# Patient Record
Sex: Female | Born: 1970
Health system: Southern US, Community
[De-identification: ages and names within clinical notes are randomized; demographics above are authoritative.]

## PROBLEM LIST (undated history)

## (undated) DIAGNOSIS — D649 Anemia, unspecified: Secondary | ICD-10-CM

## (undated) DIAGNOSIS — K5900 Constipation, unspecified: Secondary | ICD-10-CM

## (undated) HISTORY — PX: TUBAL LIGATION: SHX77

## (undated) HISTORY — DX: Anemia, unspecified: D64.9

## (undated) HISTORY — DX: Constipation, unspecified: K59.00

---

## 2004-12-31 ENCOUNTER — Observation Stay (HOSPITAL_COMMUNITY): Admission: AD | Admit: 2004-12-31 | Discharge: 2005-01-01 | Payer: Self-pay | Admitting: Obstetrics & Gynecology

## 2004-12-31 ENCOUNTER — Ambulatory Visit: Payer: Self-pay | Admitting: Obstetrics & Gynecology

## 2005-01-07 ENCOUNTER — Ambulatory Visit: Payer: Self-pay | Admitting: *Deleted

## 2005-01-27 ENCOUNTER — Ambulatory Visit (HOSPITAL_COMMUNITY): Admission: RE | Admit: 2005-01-27 | Discharge: 2005-01-27 | Payer: Self-pay | Admitting: Obstetrics & Gynecology

## 2005-04-18 ENCOUNTER — Ambulatory Visit: Payer: Self-pay | Admitting: Family Medicine

## 2005-04-18 ENCOUNTER — Inpatient Hospital Stay (HOSPITAL_COMMUNITY): Admission: AD | Admit: 2005-04-18 | Discharge: 2005-04-20 | Payer: Self-pay | Admitting: Family Medicine

## 2005-04-18 ENCOUNTER — Ambulatory Visit: Payer: Self-pay | Admitting: Obstetrics & Gynecology

## 2005-05-02 ENCOUNTER — Ambulatory Visit: Payer: Self-pay | Admitting: Family Medicine

## 2006-08-09 IMAGING — US US OB COMP +14 WK
1 series · 13 of 28 positions shown · non-contrast
Comparison: none

CLINICAL DATA: 24 weeks pregnant, no prenatal care.

[Series 1: us ob comp +14 wk · 0.33mm/px · 13 of 45 slices shown]
[im 2/45]
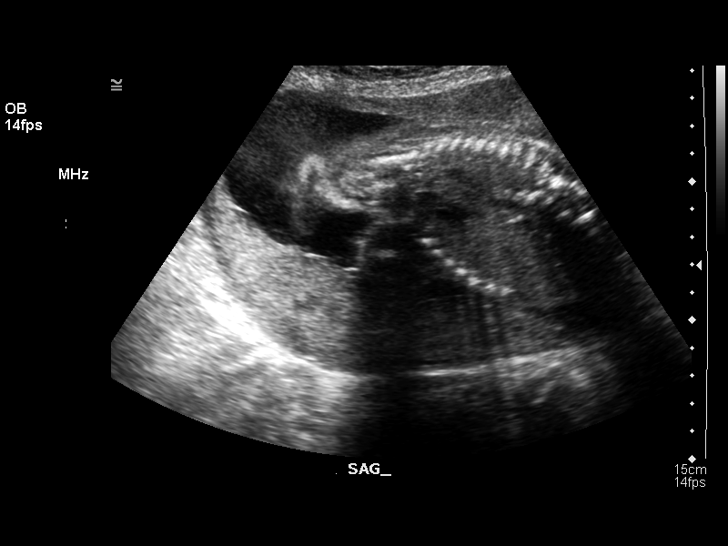
[im 5/45]
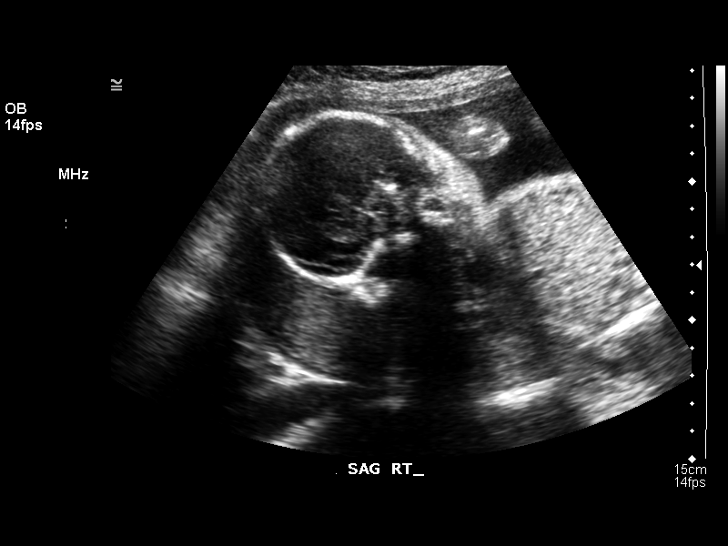
[im 9/45]
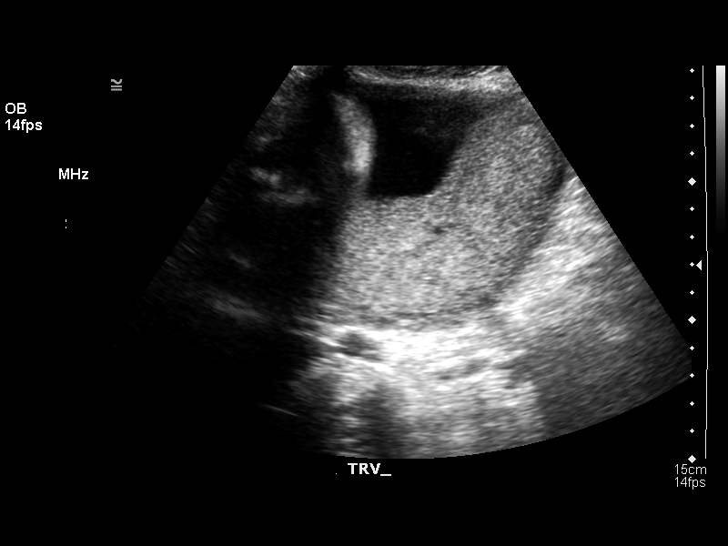
[im 12/45]
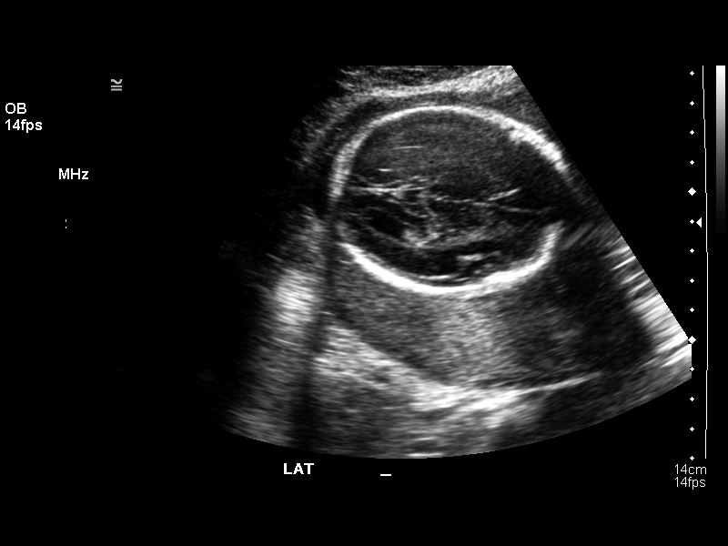
[im 15/45]
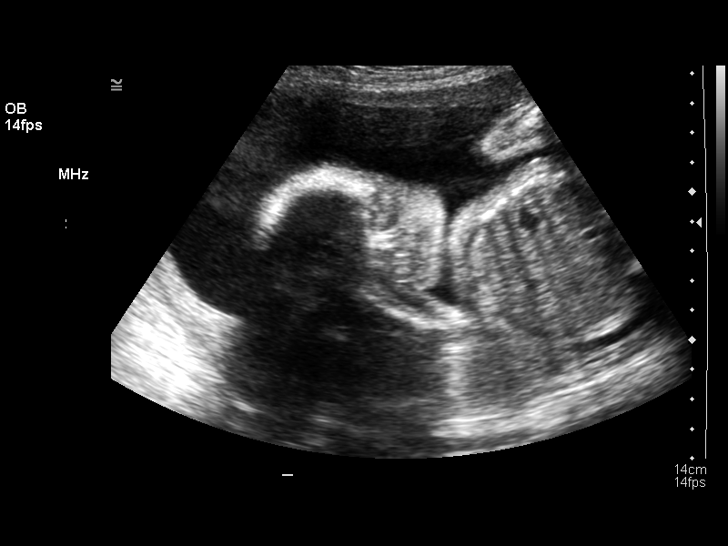
[im 18/45]
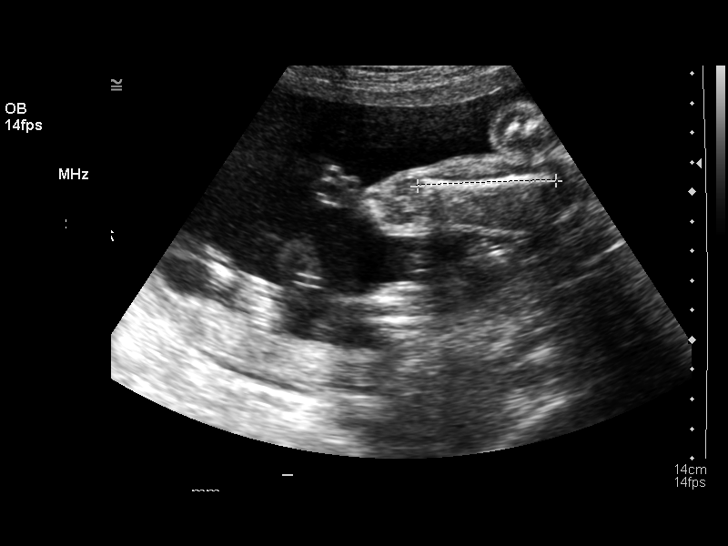
[im 23/45]
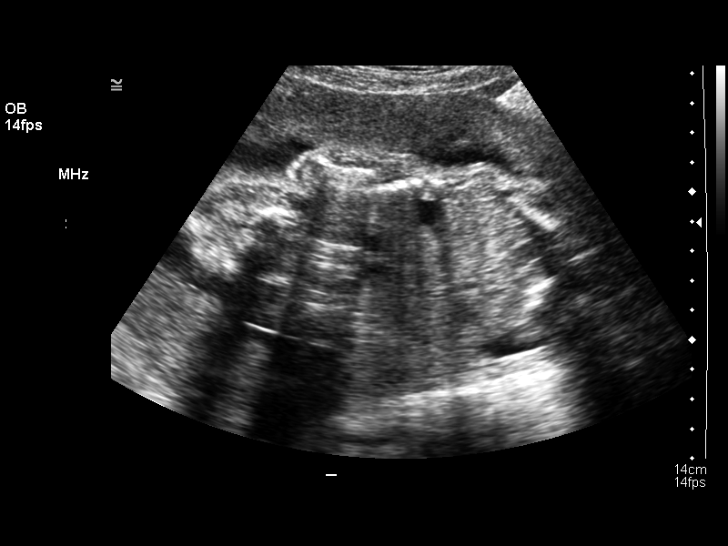
[im 27/45]
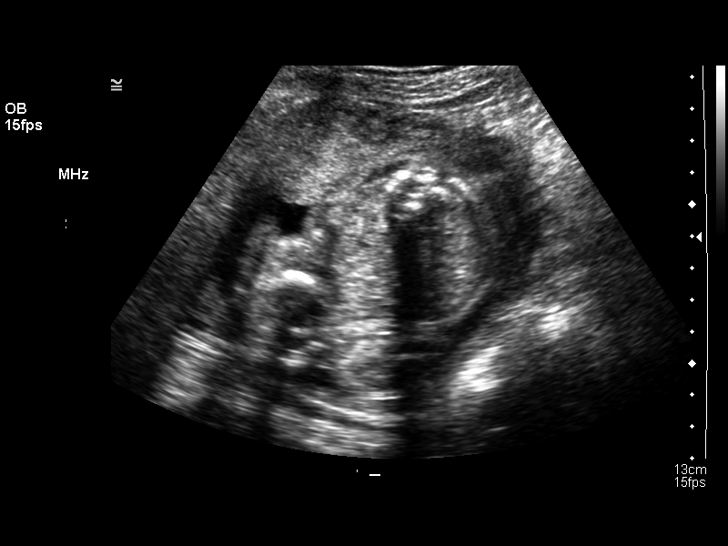
[im 30/45]
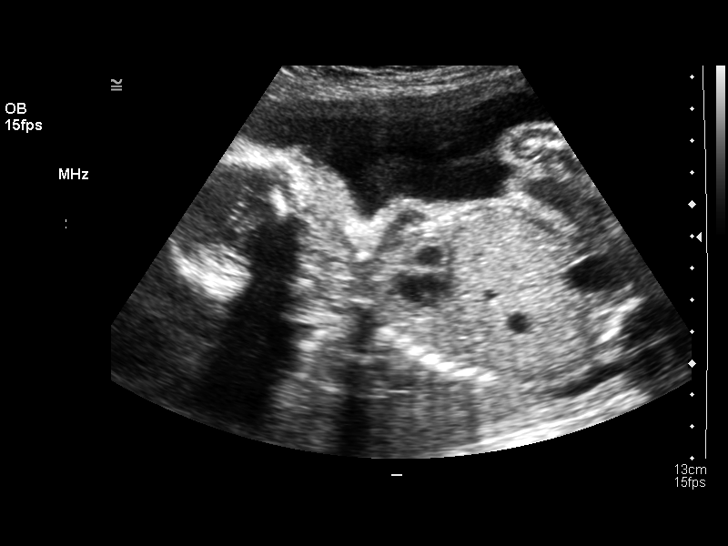
[im 33/45]
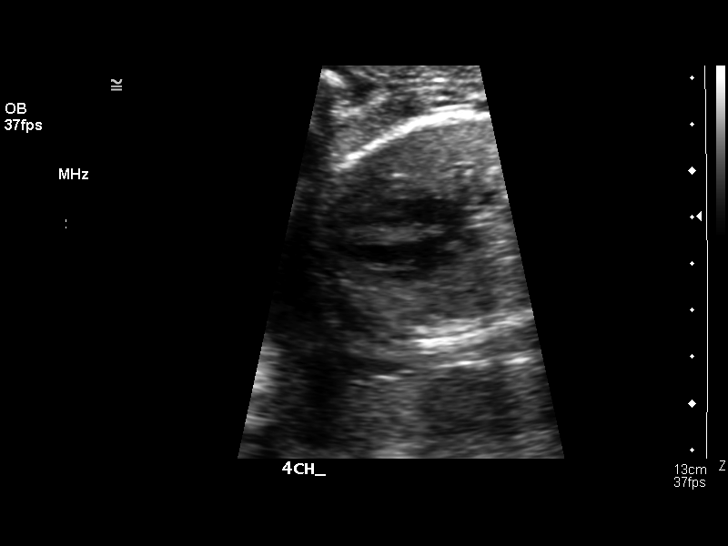
[im 36/45]
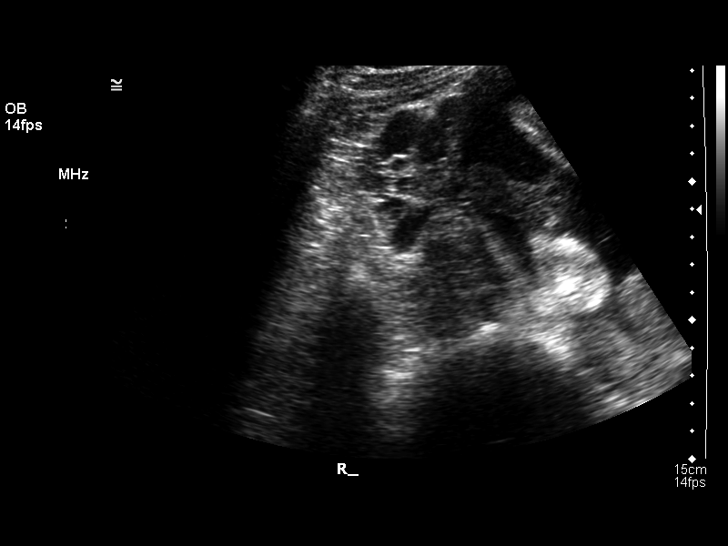
[im 40/45]
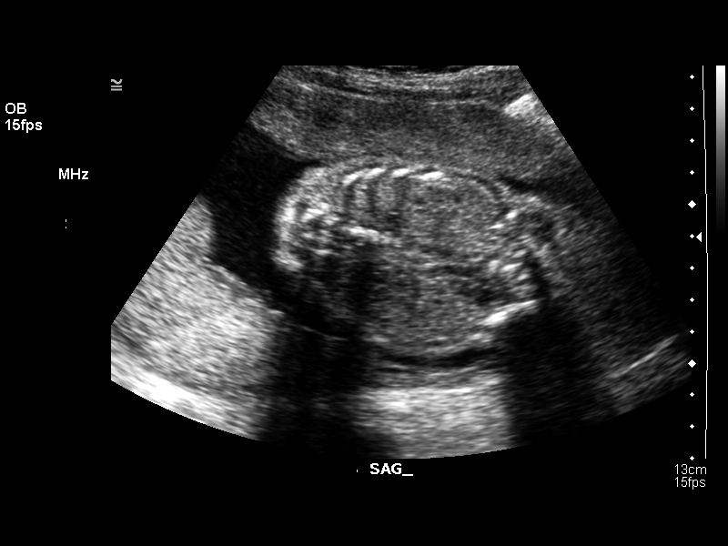
[im 43/45]
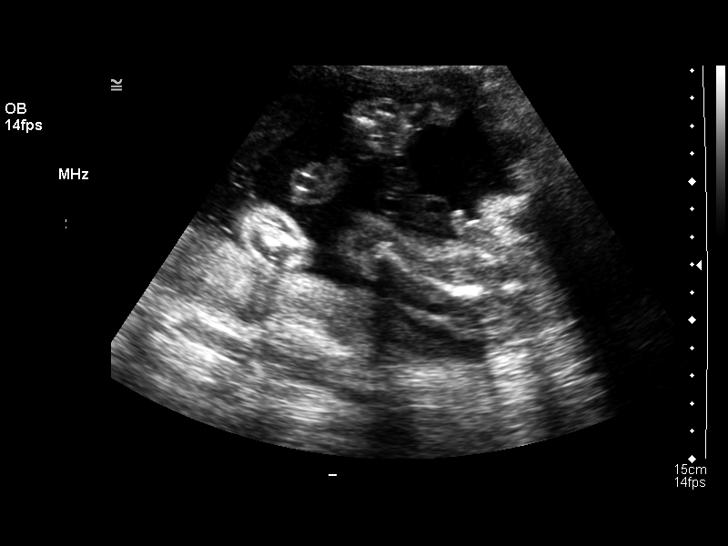

[13 of 28 positions shown; findings below may reference images not displayed]

OBSTETRICAL ULTRASOUND:
 Number of Fetuses:  1
 Heart Rate:  153
 Movement:  Yes
 Breathing:  No  
 Presentation:  Breech
 Placental Location:  Posterior
 Grade:  I
 Previa:  No
 Amniotic Fluid (Subjective):  Normal
 Amniotic Fluid (Objective):   5.7 cm Vertical pocket 

 FETAL BIOMETRY
 BPD:   6.1 cm   25 w 0 d
 HC:   22.8 cm   24 w 6 d
 AC:   20.4 cm   25 w 0 d
 FL:    4.3 cm  24 w 2 d

 MEAN GA:  24 w 6 d

 FETAL ANATOMY
 Lateral Ventricles:    Visualized 
 Thalami/CSP:      Visualized 
 Posterior Fossa:  Visualized 
 Nuchal Region:    N/A
 Spine:      Not visualized 
 4 Chamber Heart on Left:      Visualized   
 Stomach on Left:      Visualized 
 3 Vessel Cord:    Not visualized 
 Cord Insertion site:    Not visualized 
 Kidneys:  Visualized 
 Bladder:  Visualized 
 Extremities:      Not visualized 

 ADDITIONAL ANATOMY VISUALIZED:  Diaphragm and male genitalia.  

 MATERNAL UTERINE AND ADNEXAL FINDINGS
 Cervix: 4.1 cm Transabdominally.  Ovaries not visualized.
IMPRESSION: 1.  Single live intrauterine gestation with measurements corresponding to 24 weeks 6 days estimated gestational age.  
 2.  Incomplete morphologic survey due to fetal position and advanced gestational age.  
 3.  Breech presentation with normal amniotic fluid volume.
 4.  No placenta previa.

## 2006-09-05 IMAGING — US US OB FOLLOW-UP
1 series · 13 of 28 positions shown · non-contrast
Comparison: none

CLINICAL DATA: Reassess fetal anatomy.

[Series 1: us ob follow-up · 0.39mm/px · 13 of 64 slices shown]
[im 3/64]
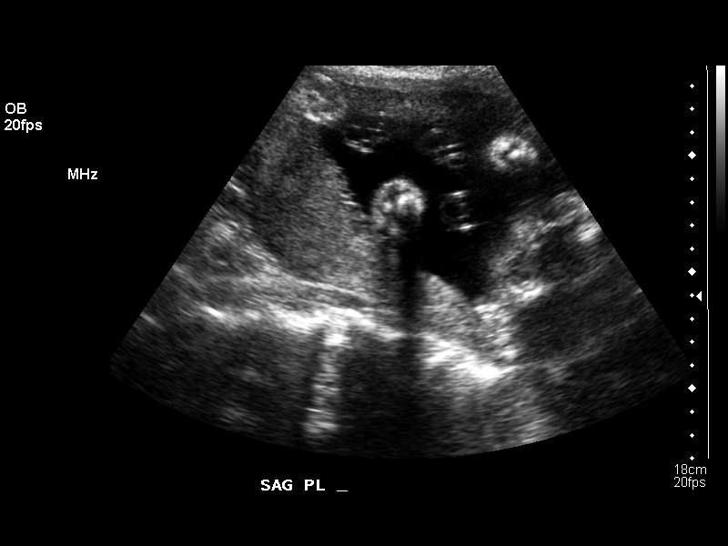
[im 8/64]
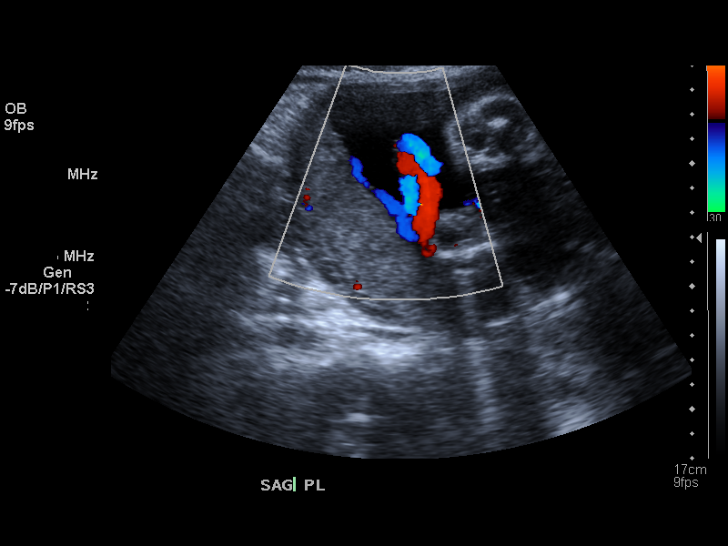
[im 12/64]
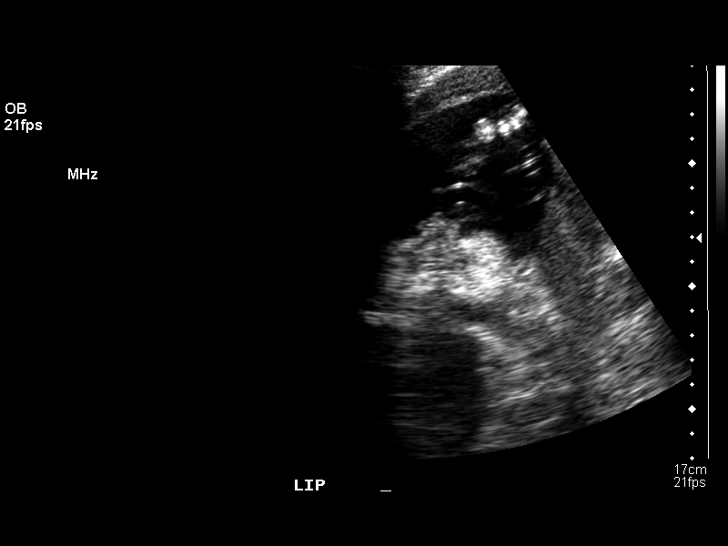
[im 17/64]
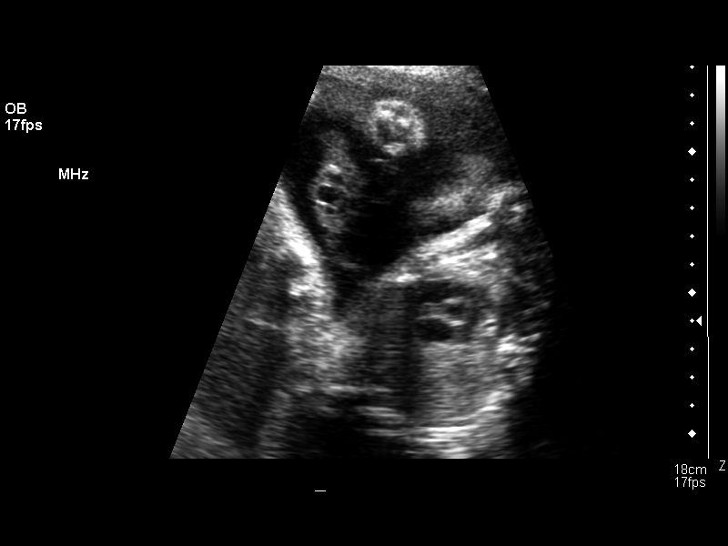
[im 22/64]
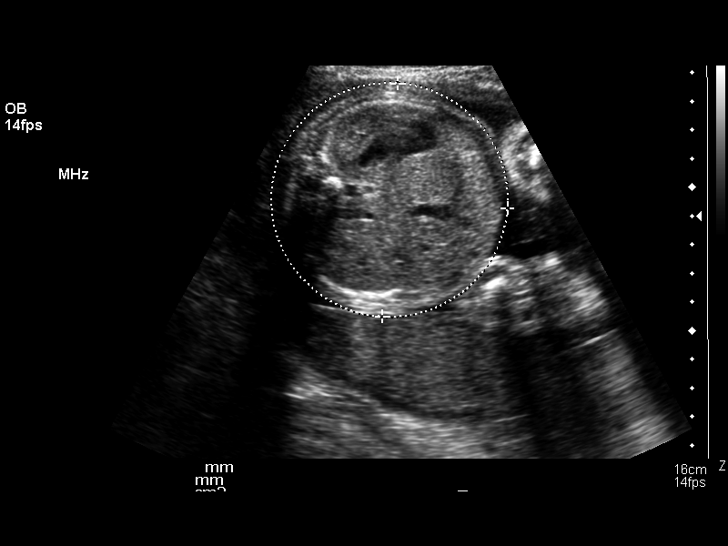
[im 26/64]
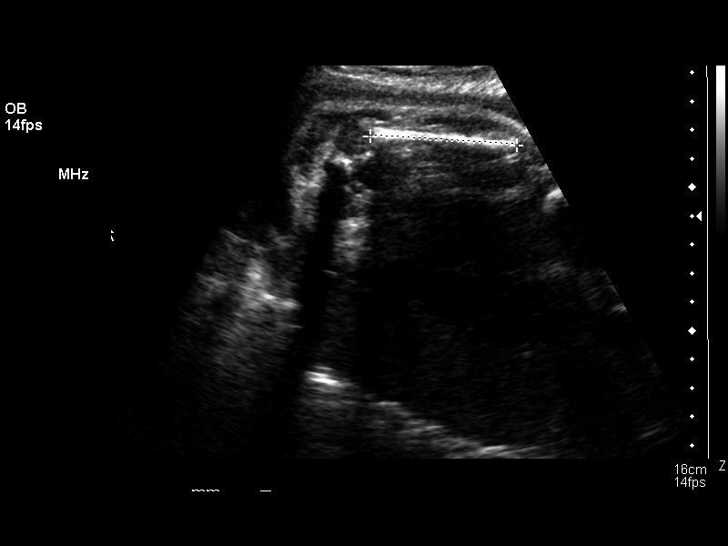
[im 33/64]
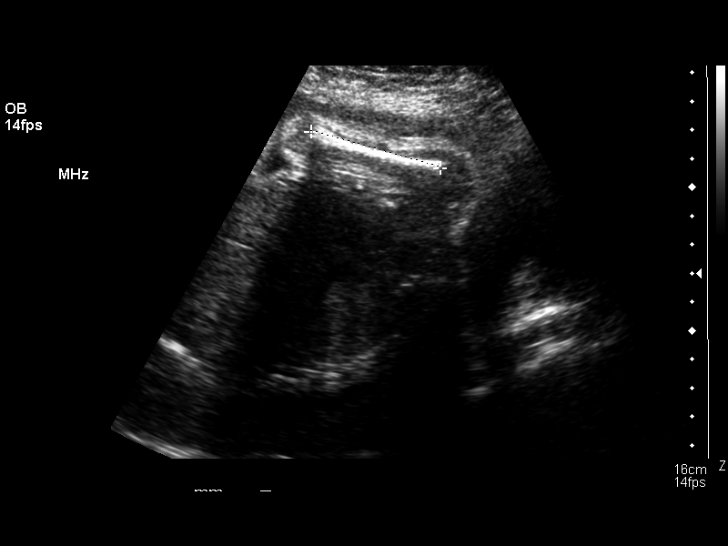
[im 38/64]
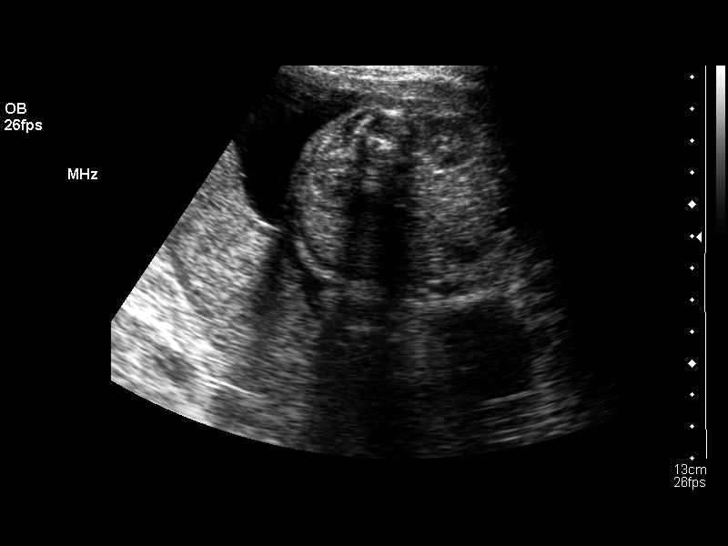
[im 43/64]
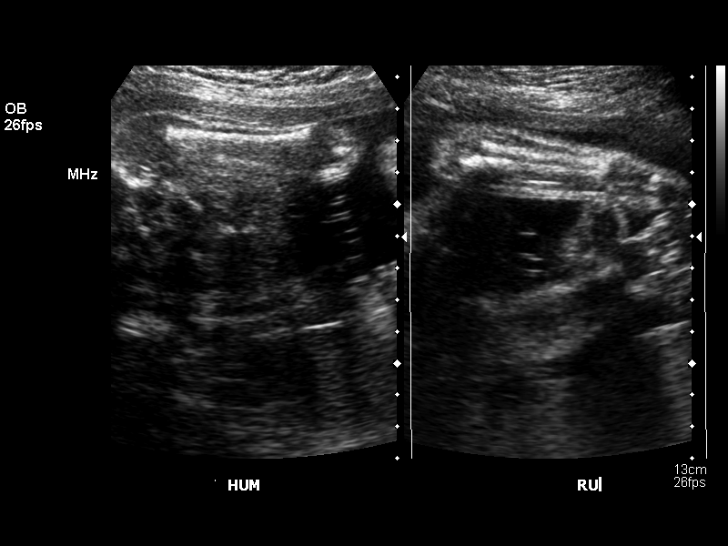
[im 47/64]
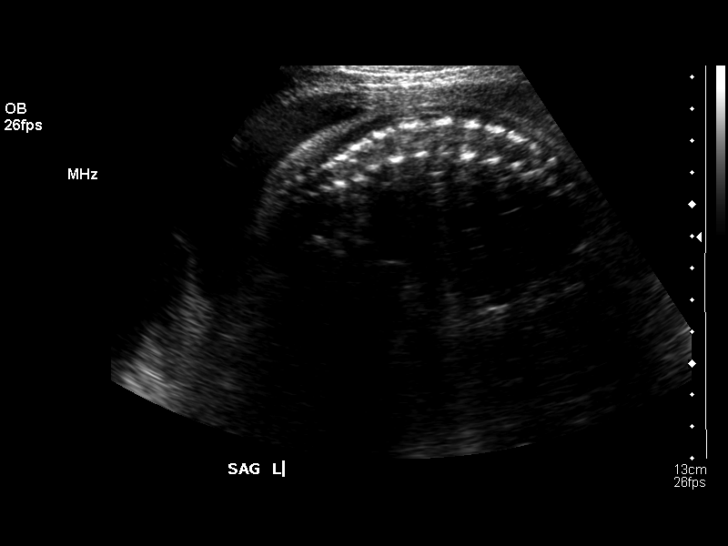
[im 52/64]
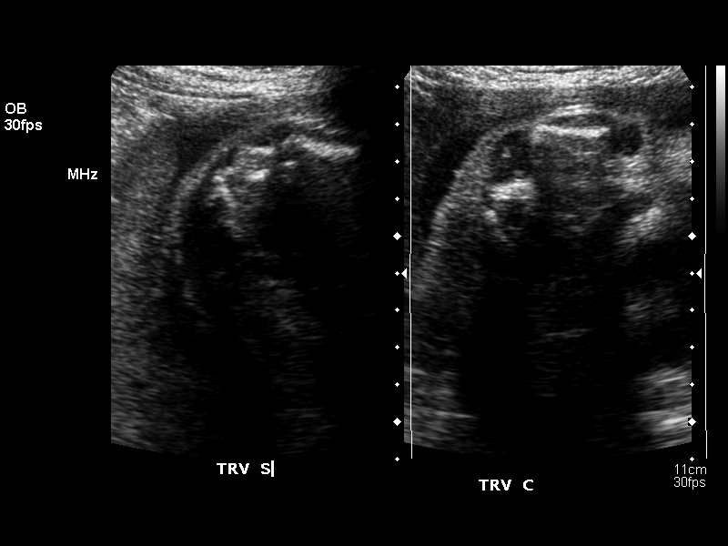
[im 57/64]
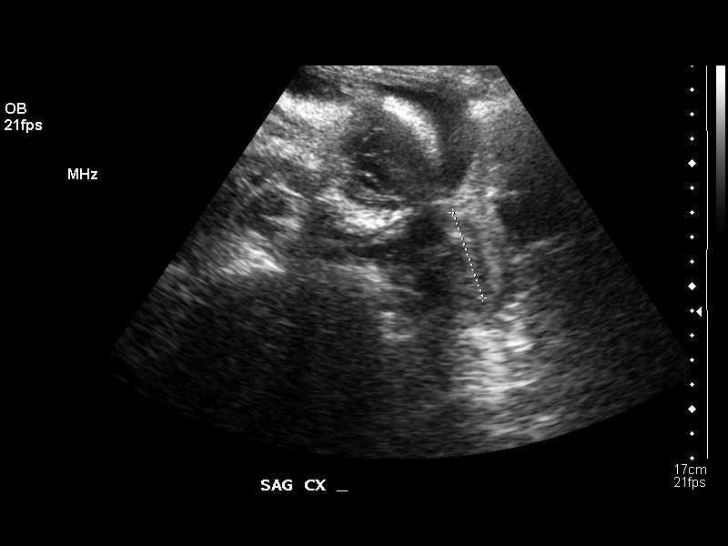
[im 61/64]
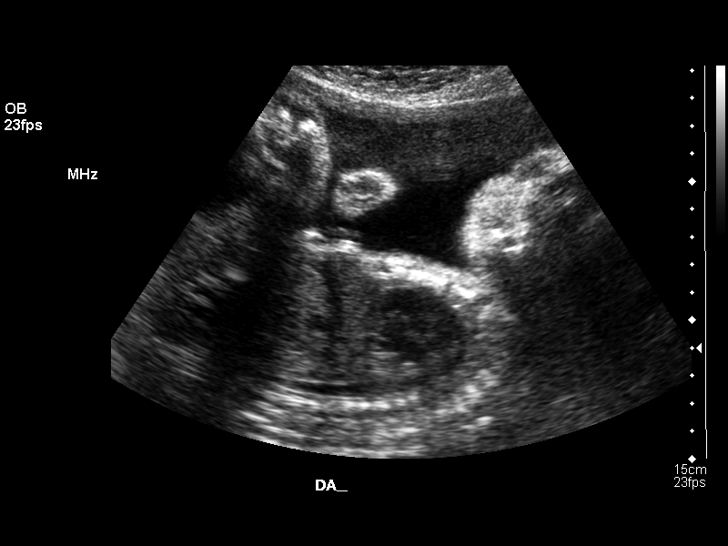

[13 of 28 positions shown; findings below may reference images not displayed]

OBSTETRICAL ULTRASOUND RE-EVALUATION:
Number of Fetuses:  1
Heart Rate:  139 bpm
Movement:  Yes 
Breathing:  Yes 
Presentation:  Cephalic 
Placental Location:  Posterior 
Grade:  1
Previa:  No 
Amniotic Fluid (subjective):  Normal 
Amniotic Fluid (objective):  AFI 13.8 cm (4th-14th %ile = 9.2-23.1 cm for 29 weeks) 

FETAL BIOMETRY
BPD:  7.3 cm  29 w  1 d
HC:  27.1 cm  29 w  4 d
AC:  25.1 cm  29 w  2 d
FL:  5.1 cm  27 w  3 d

BPD/OFD:   .80 (0.70-0.86)
FL/BPD  .70 (0.71-0.87)
FL/AC  .20 (0.20-1.24)
HC/AC  1.08 (1.05-1.22) 

MEAN GA:  28 w  4 d 
Assigned GA:  28 w  5 d

EFW:  2805 grams (H) 50-75th %ile (4404-2613 g) for 29 weeks 

FETAL ANATOMY
Lateral Ventricles:  Visualized 
Thalami/CSP:  Visualized 
Posterior Fossa:  Visualized   
Nuchal Region:  N/A
Spine:  Visualized 
4 Chamber Heart on Left:  Visualized 
Stomach on Left:  Visualized 
3 Vessel Cord:  Visualized 
Cord Insertion Site:  Not Visualized 
Kidneys:  Visualized 
Bladder:  Visualized 
Extremities:  Visualized   

ADDITIONAL ANATOMY VISUALIZED:  LVOT, RVOT, upper lip, orbits, profile, diaphragm, heel, 5th digit, ductal arch, male genitalia and nasal bone

MATERNAL UTERINE AND ADNEXAL FINDINGS
Cervix:  3.7 cm transabdominally
IMPRESSION: 1.   Single intrauterine pregnancy demonstrating an estimated gestational age by ultrasound of 28 weeks and 4 days.   Correlation with assigned gestational age by initial ultrasound of 28 weeks and 5 days suggests appropriate growth.   Currently the estimated fetal weight is between the 50 and 75th percentile for a 29 week gestation.

2.  Subjectively and quantitatively normal amniotic fluid volume and normal cervical length.   

3.  An improved anatomic assessment was possible with visualization today of the spine, three vessel cord, cardiac outflow tract, facial anatomy, diaphragm, heel, 5th digit and ductal arch.   Aortic arch and cord insertion site remain incompletely assessed due to positioning combined with advanced gestational age.   

4.  Subjectively and quantitatively normal amniotic fluid volume and normal cervical length.

## 2016-09-22 ENCOUNTER — Ambulatory Visit: Payer: Medicaid Other | Attending: Family Medicine | Admitting: Family Medicine

## 2016-09-22 ENCOUNTER — Encounter: Payer: Self-pay | Admitting: Family Medicine

## 2016-09-22 VITALS — BP 127/82 | HR 88 | Temp 98.1°F | Resp 18 | Ht 61.0 in | Wt 123.8 lb

## 2016-09-22 DIAGNOSIS — Z0000 Encounter for general adult medical examination without abnormal findings: Secondary | ICD-10-CM | POA: Diagnosis not present

## 2016-09-22 DIAGNOSIS — Z23 Encounter for immunization: Secondary | ICD-10-CM | POA: Diagnosis not present

## 2016-09-22 NOTE — Progress Notes (Signed)
Patient is here for physical  Patient denies pain for today  Patient is not taking any current medication  Patient has not eaten for today

## 2016-09-22 NOTE — Progress Notes (Signed)
Subjective:   Patient ID: Misty Singh, female    DOB: 23-Jun-1971, 46 y.o.   MRN: [id number]  Chief Complaint  Patient presents with  . Establish Care   HPI Misty Singh 46 y.o. female presents with   Annual physical examination:    She is accompanied by her daughter and a language interpreter. She denies any family history of cancer, diabetes, or hypertension. She denies any consitutional symptoms, CP, SOB, or swelling of the BLE. Denies any SI/HI. Denies symptoms of all other pertinent systems.   History reviewed. No pertinent past medical history.  History reviewed. No pertinent surgical history.  History reviewed. No pertinent family history.  Social History   Social History  . Marital status: Single    Spouse name: N/A  . Number of children: N/A  . Years of education: N/A   Occupational History  . Not on file.   Social History Main Topics  . Smoking status: Never Smoker  . Smokeless tobacco: Never Used  . Alcohol use No  . Drug use: No  . Sexual activity: Not on file   Other Topics Concern  . Not on file   Social History Narrative  . No narrative on file    No outpatient prescriptions prior to visit.   No facility-administered medications prior to visit.     No Known Allergies  Review of Systems  Constitutional: Negative.   HENT: Negative.   Eyes: Negative.   Respiratory: Negative.   Cardiovascular: Negative.   Gastrointestinal: Negative.   Genitourinary: Negative.   Musculoskeletal: Negative.   Skin: Negative.   Neurological: Negative.   Endo/Heme/Allergies: Negative.   Psychiatric/Behavioral: Negative.      Objective:    Physical Exam  Constitutional: She is oriented to person, place, and time. She appears well-developed and well-nourished.  HENT:  Head: Normocephalic and atraumatic.  Right Ear: External ear normal.  Left Ear: External ear normal.  Nose: Nose normal.  Mouth/Throat: Oropharynx is clear and moist.  Eyes: Conjunctivae and  EOM are normal. Pupils are equal, round, and reactive to light.  Neck: Normal range of motion. Neck supple.  Cardiovascular: Normal rate, regular rhythm, normal heart sounds and intact distal pulses.   Pulmonary/Chest: Effort normal and breath sounds normal.  Abdominal: Soft. She exhibits no mass. Bowel sounds are increased.  Musculoskeletal: Normal range of motion.  Lymphadenopathy:    She has no cervical adenopathy.  Neurological: She is alert and oriented to person, place, and time. She has normal reflexes.  Skin: Skin is warm and dry.  Psychiatric: She has a normal mood and affect. Her behavior is normal. Thought content normal.  Nursing note and vitals reviewed.   BP 127/82 (BP Location: Left Arm, Patient Position: Sitting, Cuff Size: Normal)   Pulse 88   Temp 98.1 F (36.7 C) (Oral)   Resp 18   Ht 5' 1"  (1.549 m)   Wt 123 lb 12.8 oz (56.2 kg)   SpO2 98%   BMI 23.39 kg/m  Wt Readings from Last 3 Encounters:  09/22/16 123 lb 12.8 oz (56.2 kg)    Lab Results  Component Value Date   TSH 0.706 09/22/2016   Lab Results  Component Value Date   WBC 8.2 09/22/2016   HCT 38.9 09/22/2016   MCV 74 (L) 09/22/2016   PLT 367 09/22/2016   Lab Results  Component Value Date   NA 139 09/22/2016   K 4.4 09/22/2016   CO2 23 09/22/2016   GLUCOSE 99 09/22/2016  BUN 9 09/22/2016   CREATININE 0.54 (L) 09/22/2016   BILITOT 0.5 09/22/2016   ALKPHOS 62 09/22/2016   AST 19 09/22/2016   ALT 17 09/22/2016   PROT 7.9 09/22/2016   ALBUMIN 4.7 09/22/2016   CALCIUM 9.4 09/22/2016   Lab Results  Component Value Date   CHOL 135 09/22/2016   Lab Results  Component Value Date   HDL 48 09/22/2016   Lab Results  Component Value Date   LDLCALC 64 09/22/2016   Lab Results  Component Value Date   TRIG 117 09/22/2016   Lab Results  Component Value Date   CHOLHDL 2.8 09/22/2016   Lab Results  Component Value Date   HGBA1C 5.5 09/22/2016       Assessment & Plan:   Problem  List Items Addressed This Visit    None    Visit Diagnoses    Annual physical exam    -  Primary   Relevant Orders   CMP14+EGFR   Lipid Panel   TSH   Hemoglobin A1c   CBC with Differential   Vitamin D, 25-hydroxy   Healthcare maintenance       -Declined referral for MM screening at this time.    Relevant Orders   Tdap vaccine greater than or equal to 7yo IM      Follow up: Return in about 2 weeks (around 10/06/2016) for routine pap .   Fredia Beets, FNP

## 2016-09-23 LAB — CMP14+EGFR
ALBUMIN: 4.7 g/dL (ref 3.5–5.5)
ALK PHOS: 62 IU/L (ref 39–117)
ALT: 17 IU/L (ref 0–32)
AST: 19 IU/L (ref 0–40)
Albumin/Globulin Ratio: 1.5 (ref 1.2–2.2)
BILIRUBIN TOTAL: 0.5 mg/dL (ref 0.0–1.2)
BUN / CREAT RATIO: 17 (ref 9–23)
BUN: 9 mg/dL (ref 6–24)
CHLORIDE: 98 mmol/L (ref 96–106)
CO2: 23 mmol/L (ref 18–29)
Calcium: 9.4 mg/dL (ref 8.7–10.2)
Creatinine, Ser: 0.54 mg/dL — ABNORMAL LOW (ref 0.57–1.00)
GFR calc Af Amer: 130 mL/min/{1.73_m2} (ref >59)
GFR calc non Af Amer: 113 mL/min/{1.73_m2} (ref >59)
GLOBULIN, TOTAL: 3.2 g/dL (ref 1.5–4.5)
GLUCOSE: 99 mg/dL (ref 65–99)
Potassium: 4.4 mmol/L (ref 3.5–5.2)
SODIUM: 139 mmol/L (ref 134–144)
Total Protein: 7.9 g/dL (ref 6.0–8.5)

## 2016-09-23 LAB — CBC WITH DIFFERENTIAL/PLATELET
BASOS ABS: 0.1 10*3/uL (ref 0.0–0.2)
Basos: 2 %
EOS (ABSOLUTE): 1.7 10*3/uL — AB (ref 0.0–0.4)
Eos: 20 %
Hematocrit: 38.9 % (ref 34.0–46.6)
Hemoglobin: 11.7 g/dL (ref 11.1–15.9)
IMMATURE GRANS (ABS): 0 10*3/uL (ref 0.0–0.1)
IMMATURE GRANULOCYTES: 0 %
LYMPHS: 33 %
Lymphocytes Absolute: 2.7 10*3/uL (ref 0.7–3.1)
MCH: 22.1 pg — ABNORMAL LOW (ref 26.6–33.0)
MCHC: 30.1 g/dL — ABNORMAL LOW (ref 31.5–35.7)
MCV: 74 fL — AB (ref 79–97)
Monocytes Absolute: 0.6 10*3/uL (ref 0.1–0.9)
Monocytes: 7 %
NEUTROS PCT: 38 %
Neutrophils Absolute: 3.2 10*3/uL (ref 1.4–7.0)
PLATELETS: 367 10*3/uL (ref 150–379)
RBC: 5.29 x10E6/uL — ABNORMAL HIGH (ref 3.77–5.28)
RDW: 14.7 % (ref 12.3–15.4)
WBC: 8.2 10*3/uL (ref 3.4–10.8)

## 2016-09-23 LAB — HEMOGLOBIN A1C
ESTIMATED AVERAGE GLUCOSE: 111 mg/dL
HEMOGLOBIN A1C: 5.5 % (ref 4.8–5.6)

## 2016-09-23 LAB — LIPID PANEL
CHOLESTEROL TOTAL: 135 mg/dL (ref 100–199)
Chol/HDL Ratio: 2.8 ratio units (ref 0.0–4.4)
HDL: 48 mg/dL (ref >39)
LDL Calculated: 64 mg/dL (ref 0–99)
Triglycerides: 117 mg/dL (ref 0–149)
VLDL Cholesterol Cal: 23 mg/dL (ref 5–40)

## 2016-09-23 LAB — TSH: TSH: 0.706 u[IU]/mL (ref 0.450–4.500)

## 2016-09-23 LAB — VITAMIN D 25 HYDROXY (VIT D DEFICIENCY, FRACTURES): Vit D, 25-Hydroxy: 17.9 ng/mL — ABNORMAL LOW (ref 30.0–100.0)

## 2016-09-30 ENCOUNTER — Telehealth: Payer: Self-pay

## 2016-09-30 ENCOUNTER — Other Ambulatory Visit: Payer: Self-pay | Admitting: Family Medicine

## 2016-09-30 DIAGNOSIS — E559 Vitamin D deficiency, unspecified: Secondary | ICD-10-CM

## 2016-09-30 DIAGNOSIS — D509 Iron deficiency anemia, unspecified: Secondary | ICD-10-CM

## 2016-09-30 MED ORDER — VITAMIN D (ERGOCALCIFEROL) 1.25 MG (50000 UNIT) PO CAPS: 50000.0000 [IU] | ORAL_CAPSULE | ORAL | 0 refills | Status: AC

## 2016-09-30 MED ORDER — FERROUS SULFATE 325 (65 FE) MG PO TABS: 325.0000 mg | ORAL_TABLET | Freq: Two times a day (BID) | ORAL | 2 refills | Status: DC

## 2016-09-30 NOTE — Telephone Encounter (Signed)
-----   Message from Lizbeth Bark, FNP sent at 09/30/2016 10:00 AM EDT ----- Kidney function normal. Liver function normal Cholesterol levels normal. Thyroid function normal HgbA1c which screens for diabetes is normal. You do not have diabetes. Vitamin D level was low. Vitamin D helps to keep bones strong. You were prescribed ergocalciferol (capsules) to increase your vitamin-d level. Once finished start taking OTC vitamin d supplement with 800 international units (IU) of vitamin-d per day. Recommend recheck in 3 months. Iron levels are low you have been prescribed an iron supplement to help increase these levels. Recommend recheck  in 3 months.  Increase your dietary iron intake. Good sources of iron include dark green leafy vegetables, meats, beans, and iron fortified cereals.

## 2016-09-30 NOTE — Telephone Encounter (Signed)
CMA call to inform patient about lab results  Patient Verify DOB  Patient was aware and understood   

## 2016-10-06 ENCOUNTER — Other Ambulatory Visit (HOSPITAL_COMMUNITY)
Admission: RE | Admit: 2016-10-06 | Discharge: 2016-10-06 | Disposition: A | Discharge status: Home / Self Care | Payer: Medicaid Other | Source: Ambulatory Visit | Attending: Family Medicine | Admitting: Family Medicine

## 2016-10-06 ENCOUNTER — Ambulatory Visit: Payer: Medicaid Other | Attending: Family Medicine | Admitting: Family Medicine

## 2016-10-06 ENCOUNTER — Encounter: Payer: Self-pay | Admitting: Family Medicine

## 2016-10-06 VITALS — BP 118/78 | HR 96 | Temp 98.1°F | Resp 18 | Ht 61.0 in | Wt 125.2 lb

## 2016-10-06 DIAGNOSIS — Z01419 Encounter for gynecological examination (general) (routine) without abnormal findings: Secondary | ICD-10-CM | POA: Insufficient documentation

## 2016-10-06 DIAGNOSIS — Z113 Encounter for screening for infections with a predominantly sexual mode of transmission: Secondary | ICD-10-CM

## 2016-10-06 DIAGNOSIS — Z9851 Tubal ligation status: Secondary | ICD-10-CM | POA: Diagnosis not present

## 2016-10-06 NOTE — Progress Notes (Signed)
   Subjective:  Patient ID: Misty Singh, female    DOB: June 08, 1971  Age: 46 y.o. MRN: [id number]  CC: No chief complaint on file.   HPI Misty Singh presents for   Routine pap: Denies any vaginal lesions, discharge, or dysuria  Denies any family history of gynecological or breast cancers. Reports regular menstrual cycles lasting 3 days. History of tubal ligation performed after the birth of her youngest son 11 years ago. Denies any denting, dimpling, lumps, or nipple discharge of the breasts. Declines breast cancer screening.   Outpatient Medications Prior to Visit  Medication Sig Dispense Refill  . ferrous sulfate 325 (65 FE) MG tablet Take 1 tablet (325 mg total) by mouth 2 (two) times daily with a meal. 60 tablet 2  . Vitamin D, Ergocalciferol, (DRISDOL) 50000 units CAPS capsule Take 1 capsule (50,000 Units total) by mouth every 7 (seven) days. 8 capsule 0   No facility-administered medications prior to visit.     ROS Review of Systems  Respiratory: Negative.   Cardiovascular: Negative.   Gastrointestinal: Negative.   Genitourinary: Negative.   Skin: Negative.      Objective:  BP 118/78 (BP Location: Left Arm, Patient Position: Sitting, Cuff Size: Normal)   Pulse 96   Temp 98.1 F (36.7 C) (Oral)   Resp 18   Ht  (1.549 m)   Wt 125 lb 3.2 oz (56.8 kg)   SpO2 99%   BMI 23.66 kg/m   BP/Weight 10/06/2016 09/22/2016  Systolic BP 118 127  Diastolic BP 78 82  Wt. (Lbs) 125.2 123.8  BMI 23.66 23.39     Physical Exam  Cardiovascular: Normal rate, regular rhythm, normal heart sounds and intact distal pulses.   Pulmonary/Chest: Effort normal and breath sounds normal.  Breast wnl. No masses, nipple drainage, denting, or dimpling.   Abdominal: Soft. Bowel sounds are normal.  Genitourinary: Vagina normal. Cervix exhibits discharge (moderate, mucoid-like , thick drainage. ).  Skin: Skin is warm and dry.  Nursing note and vitals reviewed.   Assessment & Plan:   Problem  List Items Addressed This Visit    None    Visit Diagnoses    Encounter for well woman exam with routine gynecological exam    -  Primary   Relevant Orders   Cytology - PAP [facility]   Screening for STDs (sexually transmitted diseases)       Relevant Orders   HEP, RPR, HIV Panel        Follow-up: Return in about 3 years (around 10/07/2019) for Routine Pap.   Lizbeth Bark FNP

## 2016-10-06 NOTE — Progress Notes (Signed)
Patient is here for PAP   Patient concerns was about ever since she's taking her Vitamin D & iron medication she been feeling tired mostly at nights   Patient denies pain for today  Patient has taking her iron pill for today

## 2016-10-06 NOTE — Patient Instructions (Addendum)
Pap Test Why am I having this test? A pap test is sometimes called a pap smear. It is a screening test that is used to check for signs of cancer of the vagina, cervix, and uterus. The test can also identify the presence of infection or precancerous changes. Your health care provider will likely recommend you have this test done on a regular basis. This test may be done:  Every 3 years, starting at age 47.  Every 5 years, in combination with testing for the presence of human papillomavirus (HPV).  More or less often depending on other medical conditions. What kind of sample is taken? Using a small cotton swab, plastic spatula, or brush, your health care provider will collect a sample of cells from the surface of your cervix. Your cervix is the opening to your uterus, also called a womb. Secretions from the cervix and vagina may also be collected. How do I prepare for this test?  Be aware of where you are in your menstrual cycle. You may be asked to reschedule the test if you are menstruating on the day of the test.  You may need to reschedule if you have a known vaginal infection on the day of the test.  You may be asked to avoid douching or taking a bath the day before or the day of the test.  Some medicines can cause abnormal test results, such as digitalis and tetracycline. Talk with your health care provider before your test if you take one of these medicines. What do the results mean? Abnormal test results may indicate a number of health conditions. These may include:  Cancer. Although pap test results cannot be used to diagnose cancer of the cervix, vagina, or uterus, they may suggest the possibility of cancer. Further tests would be required to determine if cancer is present.  Sexually transmitted disease.  Fungal infection.  Parasite infection.  Herpes infection.  A condition causing or contributing to infertility. It is your responsibility to obtain your test results. Ask  the lab or department performing the test when and how you will get your results. Contact your health care provider to discuss any questions you have about your results. Talk with your health care provider to discuss your results, treatment options, and if necessary, the need for more tests. Talk with your health care provider if you have any questions about your results. This information is not intended to replace advice given to you by your health care provider. Make sure you discuss any questions you have with your health care provider. Document Released: 09/06/2002 Document Revised: 02/20/2016 Document Reviewed: 11/07/2013 Elsevier Interactive Patient Education  2017 [employer]  Breast Self-Awareness Breast self-awareness means:  Knowing how your breasts look.  Knowing how your breasts feel.  Checking your breasts every month for changes.  Telling your doctor if you notice a change in your breasts. Breast self-awareness allows you to notice a breast problem early while it is still small. How to do a breast self-exam One way to learn what is normal for your breasts and to check for changes is to do a breast self-exam. To do a breast self-exam: Look for Changes   1. Take off all the clothes above your waist. 2. Stand in front of a mirror in a room with good lighting. 3. Put your hands on your hips. 4. Push your hands down. 5. Look at your breasts and nipples in the mirror to see if one breast or nipple looks different than  the other. Check to see if:  The shape of one breast is different.  The size of one breast is different.  There are wrinkles, dips, and bumps in one breast and not the other. 6. Look at each breast for changes in your skin, such as:  Redness.  Scaly areas. 7. Look for changes in your nipples, such as:  Liquid around the nipples.  Bleeding.  Dimpling.  Redness.  A change in where the nipples are. Feel for Changes  1. Lie on your back on the  floor. 2. Feel each breast. To do this, follow these steps:  Pick a breast to feel.  Put the arm closest to that breast above your head.  Use your other arm to feel the nipple area of your breast. Feel the area with the pads of your three middle fingers by making small circles with your fingers. For the first circle, press lightly. For the second circle, press harder. For the third circle, press even harder.  Keep making circles with your fingers at the light, harder, and even harder pressures as you move down your breast. Stop when you feel your ribs.  Move your fingers a little toward the center of your body.  Start making circles with your fingers again, this time going up until you reach your collarbone.  Keep making up and down circles until you reach your armpit. Remember to keep using the three pressures.  Feel the other breast in the same way. 3. Sit or stand in the shower or tub. 4. With soapy water on your skin, feel each breast the same way you did in step 2, when you were lying on the floor. Write Down What You Find   After doing the self-exam, write down:  What is normal for each breast.  Any changes you find in each breast.  When you last had your period. How often should I check my breasts? Check your breasts every month. If you are breastfeeding, the best time to check them is after you feed your baby or after you use a breast pump. If you get periods, the best time to check your breasts is 5-7 days after your period is over. When should I see my doctor? See your doctor if you notice:  A change in shape or size of your breasts or nipples.  A change in the skin of your breast or nipples, such as red or scaly skin.  Unusual fluid coming from your nipples.  A lump or thick area that was not there before.  Pain in your breasts.  Anything that concerns you. This information is not intended to replace advice given to you by your health care provider. Make sure  you discuss any questions you have with your health care provider. Document Released: 12/03/2007 Document Revised: 11/22/2015 Document Reviewed: 05/06/2015 Elsevier Interactive Patient Education  2017 [employer].

## 2016-10-07 LAB — CERVICOVAGINAL ANCILLARY ONLY
BACTERIAL VAGINITIS: NEGATIVE
Candida vaginitis: NEGATIVE
Chlamydia: NEGATIVE
Neisseria Gonorrhea: NEGATIVE
Trichomonas: NEGATIVE

## 2016-10-07 LAB — CYTOLOGY - PAP
DIAGNOSIS: NEGATIVE
HPV (WINDOPATH): NOT DETECTED

## 2016-10-08 ENCOUNTER — Telehealth: Payer: Self-pay

## 2016-10-08 NOTE — Telephone Encounter (Signed)
Patient daughter return CMA call   Patient daughter Verify DOB  Patient daughter  was aware and understood

## 2016-10-08 NOTE — Telephone Encounter (Signed)
CMA call patient to inform PAP results  Patient did not answer but left a VM stating detailed message & have any questions just to call back at the office

## 2016-10-08 NOTE — Telephone Encounter (Signed)
-----   Message from Lizbeth Bark, FNP sent at 10/08/2016  4:45 AM EDT ----- Pap smear showed no lesions or malignancy. Genital herpes, Gonorrhea, Chlamydia, BV, Yeast, and Trichomonas were all negative.

## 2016-10-09 LAB — CERVICOVAGINAL ANCILLARY ONLY: Herpes: NEGATIVE

## 2016-10-14 ENCOUNTER — Telehealth: Payer: Self-pay

## 2016-10-14 NOTE — Telephone Encounter (Signed)
CMA call patient regarding her herpes infection being negative  CMA spoke with patient daughter   Daughter Verify DOB  Daughter was aware and understood

## 2016-10-14 NOTE — Telephone Encounter (Signed)
-----   Message from Lizbeth Bark, FNP sent at 10/10/2016  5:25 AM EDT ----- Negative for genital herpes 1 and 2 infections .

## 2016-11-25 ENCOUNTER — Other Ambulatory Visit: Payer: Self-pay | Admitting: Family Medicine

## 2016-11-25 DIAGNOSIS — E559 Vitamin D deficiency, unspecified: Secondary | ICD-10-CM

## 2017-01-05 ENCOUNTER — Encounter: Payer: Self-pay | Admitting: Family Medicine

## 2017-01-05 ENCOUNTER — Ambulatory Visit: Payer: Medicaid Other | Attending: Family Medicine | Admitting: Family Medicine

## 2017-01-05 VITALS — BP 109/71 | HR 82 | Temp 98.0°F | Resp 18 | Ht 61.0 in | Wt 125.2 lb

## 2017-01-05 DIAGNOSIS — D509 Iron deficiency anemia, unspecified: Secondary | ICD-10-CM | POA: Diagnosis not present

## 2017-01-05 DIAGNOSIS — D649 Anemia, unspecified: Secondary | ICD-10-CM | POA: Diagnosis present

## 2017-01-05 DIAGNOSIS — Z09 Encounter for follow-up examination after completed treatment for conditions other than malignant neoplasm: Secondary | ICD-10-CM | POA: Diagnosis not present

## 2017-01-05 DIAGNOSIS — Z8639 Personal history of other endocrine, nutritional and metabolic disease: Secondary | ICD-10-CM

## 2017-01-05 DIAGNOSIS — Z862 Personal history of diseases of the blood and blood-forming organs and certain disorders involving the immune mechanism: Secondary | ICD-10-CM

## 2017-01-05 DIAGNOSIS — E559 Vitamin D deficiency, unspecified: Secondary | ICD-10-CM | POA: Diagnosis not present

## 2017-01-05 NOTE — Progress Notes (Signed)
Patient is here for iron check   Patient already ran out of her iron medication   Patient denies pain for today

## 2017-01-05 NOTE — Progress Notes (Signed)
   Subjective:  Patient ID: Misty SilversmithMin H Staff, female    DOB: 09/20/1970  Age: 46 y.o. MRN: [id number]  CC: Follow-up    HPI Misty Singh presents for follow up. She is accompanied by her daughter who interpreters for her. History of anemia and vitamin d deficiency. Anemia was found by CBC.  It has been present for 3 months. She denies any dyspnea, fatigue, hematochezia, melena and menorrhagia.She reports completing her course of iron supplement and vitamin d supplement.      Outpatient Medications Prior to Visit  Medication Sig Dispense Refill  . ferrous sulfate 325 (65 FE) MG tablet Take 1 tablet (325 mg total) by mouth 2 (two) times daily with a meal. 60 tablet 2   No facility-administered medications prior to visit.     ROS Review of Systems  Constitutional: Negative.   Respiratory: Negative.   Cardiovascular: Negative.   Gastrointestinal: Negative.   Skin: Negative.         Objective:  BP 109/71 (BP Location: Left Arm, Patient Position: Sitting, Cuff Size: Normal)   Pulse 82   Temp 98 F (36.7 C) (Oral)   Resp 18   Ht 5\' 1"  (1.549 m)   Wt 125 lb 3.2 oz (56.8 kg)   SpO2 100%   BMI 23.66 kg/m   BP/Weight 01/05/2017 10/06/2016 09/22/2016  Systolic BP 109 118 127  Diastolic BP 71 78 82  Wt. (Lbs) 125.2 125.2 123.8  BMI 23.66 23.66 23.39     Physical Exam  Constitutional: She is oriented to person, place, and time. She appears well-developed and well-nourished.  HENT:  Mouth/Throat: Oropharynx is clear and moist.  Eyes: Conjunctivae are normal.  Cardiovascular: Normal rate, regular rhythm, normal heart sounds and intact distal pulses.   Pulmonary/Chest: Effort normal and breath sounds normal.  Abdominal: Soft. Bowel sounds are normal. There is no tenderness.  Neurological: She is alert and oriented to person, place, and time.  Skin: Skin is warm and dry.  Psychiatric: She has a normal mood and affect.  Nursing note and vitals reviewed.  Assessment & Plan:   Problem  List Items Addressed This Visit      Other   History of iron deficiency anemia   Relevant Orders   CBC With Differential   History of vitamin D deficiency   Relevant Orders   Vitamin D, 25-hydroxy    Other Visit Diagnoses    Follow up    -  Primary   Relevant Orders   Vitamin D, 25-hydroxy   CBC With Differential        Follow-up: Return As needed.   Lizbeth BarkMandesia R Mariacristina Aday FNP

## 2017-01-05 NOTE — Patient Instructions (Signed)
You will be called with your lab results.

## 2017-01-06 LAB — CBC WITH DIFFERENTIAL
BASOS: 1 %
Basophils Absolute: 0.1 10*3/uL (ref 0.0–0.2)
EOS (ABSOLUTE): 1.4 10*3/uL — ABNORMAL HIGH (ref 0.0–0.4)
EOS: 18 %
HEMATOCRIT: 38.6 % (ref 34.0–46.6)
HEMOGLOBIN: 12.4 g/dL (ref 11.1–15.9)
IMMATURE GRANS (ABS): 0 10*3/uL (ref 0.0–0.1)
Immature Granulocytes: 0 %
LYMPHS: 32 %
Lymphocytes Absolute: 2.4 10*3/uL (ref 0.7–3.1)
MCH: 23.7 pg — ABNORMAL LOW (ref 26.6–33.0)
MCHC: 32.1 g/dL (ref 31.5–35.7)
MCV: 74 fL — AB (ref 79–97)
MONOCYTES: 7 %
Monocytes Absolute: 0.5 10*3/uL (ref 0.1–0.9)
NEUTROS PCT: 42 %
Neutrophils Absolute: 3.3 10*3/uL (ref 1.4–7.0)
RBC: 5.23 x10E6/uL (ref 3.77–5.28)
RDW: 15 % (ref 12.3–15.4)
WBC: 7.7 10*3/uL (ref 3.4–10.8)

## 2017-01-06 LAB — VITAMIN D 25 HYDROXY (VIT D DEFICIENCY, FRACTURES): Vit D, 25-Hydroxy: 22.6 ng/mL — ABNORMAL LOW (ref 30.0–100.0)

## 2017-01-08 ENCOUNTER — Telehealth: Payer: Self-pay

## 2017-01-08 ENCOUNTER — Other Ambulatory Visit: Payer: Self-pay | Admitting: Family Medicine

## 2017-01-08 DIAGNOSIS — E559 Vitamin D deficiency, unspecified: Secondary | ICD-10-CM

## 2017-01-08 MED ORDER — VITAMIN D (ERGOCALCIFEROL) 1.25 MG (50000 UNIT) PO CAPS: ORAL_CAPSULE | ORAL | 0 refills | Status: DC

## 2017-01-08 MED FILL — VIT D2 1.25 MG (50,000 UNIT: 1.25 MG | 28 days supply | Qty: 4 | Fill #0

## 2017-01-08 NOTE — Telephone Encounter (Signed)
CMA call regarding lab results   Patient verify DOB  Patient was aware and understood  

## 2017-01-08 NOTE — Telephone Encounter (Signed)
-----   Message from Misty BarkMandesia R Hairston, FNP sent at 01/08/2017  9:02 AM EDT ----- Vitamin D level is still low. Vitamin D helps to keep bones strong. You were prescribed ergocalciferol (capsules) to increase your vitamin-d level.  Labs that evaluates your blood cells show improvement. No anemia.

## 2018-02-19 ENCOUNTER — Ambulatory Visit: Payer: Medicaid Other | Attending: Family Medicine | Admitting: Family Medicine

## 2018-02-19 ENCOUNTER — Other Ambulatory Visit: Payer: Self-pay

## 2018-02-19 ENCOUNTER — Encounter: Payer: Self-pay | Admitting: Family Medicine

## 2018-02-19 VITALS — BP 120/82 | HR 87 | Temp 99.0°F | Resp 18 | Ht 61.0 in | Wt 132.0 lb

## 2018-02-19 DIAGNOSIS — D509 Iron deficiency anemia, unspecified: Secondary | ICD-10-CM | POA: Diagnosis not present

## 2018-02-19 DIAGNOSIS — R5383 Other fatigue: Secondary | ICD-10-CM

## 2018-02-19 DIAGNOSIS — E559 Vitamin D deficiency, unspecified: Secondary | ICD-10-CM | POA: Diagnosis not present

## 2018-02-19 DIAGNOSIS — D649 Anemia, unspecified: Secondary | ICD-10-CM | POA: Diagnosis present

## 2018-02-19 DIAGNOSIS — R7303 Prediabetes: Secondary | ICD-10-CM | POA: Diagnosis not present

## 2018-02-19 LAB — POCT GLYCOSYLATED HEMOGLOBIN (HGB A1C)
HbA1c POC (<> result, manual entry): 5.7 %
HbA1c, POC (controlled diabetic range): 5.7 % (ref 0.0–7.0)
HbA1c, POC (prediabetic range): 5.7 % (ref 5.7–6.4)
Hemoglobin A1C: 5.7 % — AB (ref 4.0–5.6)

## 2018-02-19 NOTE — Patient Instructions (Addendum)
Phng ng?a b?nh ti?u ???ng tup 2 Preventing Type 2 Diabetes Mellitus Ti?u ???ng tup 2 (b?nh ti?u ???ng tup 2) l b?nh ko di (m?n tnh) ?nh h??ng ??n m?c ???ng (glucose) trong mu. Thng th???ng, m?t hc mn tn l insulin cho phe?p glucose ?i va?o va?o t? ba?o trong c? th?. Nh??ng t? ba?o na?y s?? du?ng glucose ?? ta?o ra n?ng l???ng. ?? b?nh ti?u ????ng tup 2, c th? c m?t ho??c ca? hai v?n ?? sau:  C? th? khng s?n xu?t ?? insulin.  C? th? khng ?a?p ??ng thch h??p v??i insulin ma? n ta?o ra (kha?ng insulin).  Ti?nh tra?ng kha?ng insulin ho??c thi?u h?t insulin lm cho ???ng d? th?a tch t? trong mu thay v ?i vo trong cc t? bo. K?t qu? l, glucose trong mu cao (t?ng ???ng huy?t) pht sinh, c th? gy ra nhi?u bi?n ch?ng. B? th?a cn ho?c bo ph ho?c c l?i s?ng khng ho?t ??ng (t?nh t?i) c th? lm t?ng nguy c? b? ti?u ???ng c?a qu v?. C th? lm ch?m ho?c phng ng?a ti?u ???ng tup 2 b?ng cch th?c hi?n m?t s? thay ??i nh?t ??nh v? dinh d??ng v l?i s?ng. C th? th?c hi?n nh?ng thay ??i dinh d??ng no?  ?n cc b?a ?n chnh v b?a ?n nh? th??ng xuyn. Mang theo m?t b?a ?n nh? lnh m?nh khi qu v? b? ?i gi?a cc b?a ?n chnh, ch?ng h?n nh? tri cy ho?c m?t n?m ??y qu? h?ch.  ?n th?t n?c v protein th?t n?c c t ch?t bo bo ha, ch?ng h?n nh? th?t g, c, lng tr?ng tr?ng v ??u h?t. [place] cc lo?i th?t ch? bi?n s??n.  ?n th?t nhi?u tri cy v rau c? v nhi?u ng? c?c ch?a ???c x? l (ng? c?c nguyn cm). Qu v? nn ?n: ? 1?2 c?c tri cy m?i ngy. ? 2?3 c?c rau c? m?i ngy. ? 6?8 ao-x? ng? c?c nguyn cm m?i ngy, ch?ng h?n nh? y?n m?ch, la m nguyn cm, bulgur, g?o l?t, dim m?ch v k.  ?n cc s?n ph?m s??a i?t ch?t be?o, ch?ng h?n nh? s?a, s??a chua va? pho mt.  ?n cc th?c ph?m ch?a ch?t bo lnh m?nh, ch?ng h?n nh? qu? h?ch, qu? b?, d?u  liu v d?u [place].  U?ng n??c trong c? ngy. Trnh cc ?? u?ng c thm ???ng, ch?ng h?n nh? soda v tr  ng?t.  Tun th? ch? d?n c?a chuyn gia ch?m [place] s?c kh?e v? cc h?n ch? ?n ho?c u?ng c? th?.  Ki?m sot l??ng th?c ph?m m qu v? ?n m?i l?n (kch c? kh?u ph?n). ? Ki?m tra nhn th?c ph?m ?? tm kch c? kh?u ph?n c?a th?c ph?m. ? S? d?ng cn nh b?p ?? cn l??ng th?c ph?m.  p ch?o ho?c h?p ?? ?n thay v chin xo. N?u v?i n??c ho?c n??c canh thay v d?u ho?c b?.  Gi?i h?n l??ng tiu th?: ? Mu?i (natri). ?n khng qu 1 mu?ng tr (2.400 mg) natri m?i ngy. N?u qu v? b? b?nh tim ho?c cao huy?t p, ?n t h?n ? mu?ng c ph (1.500 mg) natri m?t ngy. ? Ch?t bo bo ha. Ch?t bo ny c th? r?n ? nhi?t ?? phng, ch?ng h?n nh? b? ho?c m? trn th?t. C th? th?c hi?n nh?ng thay ??i no v? l?i s?ng?  Ho?t ??ng  Ho?t ??ng th? ch?t c??ng ?? v?a ph?i trong t nh?t 30 pht vo t nh?t 5 ngy trong tu?n  ho?c theo ch? d?n c?a chuyn gia ch?m [place] s?c kh?e.  H?i chuyn gia ch?m [place] s?c kh?e v? cc ho?t ??ng no an ton cho qu v?. Ph?i h?p cc ho?t ??ng th? ch?t c th? l t?t nh?t, ch?ng h?n nh? ?i b?, b?i, ??p xe v t?p s?c m?nh.  C? g?ng thm ho?t ??ng th? ch?t vo ngy c?a qu v?. V d?: ? ?? xe ? cc ?i?m cch xa h?n bnh th??ng, nh? v?y qu v? s? ?i b? nhi?u h?n. V d?: ?? xe ? m?t gc xa c?a bi ??u xe khi qu v? ?i ln v?n phng ho?c c?a hng t?p ha. ? ?i d?o trong gi? ngh? ?n tr?a. ? S? d?ng thang b? thay v thang my. Gi?m cn  Gi?m cn theo ch? d?n. Chuyn gia ch?m [place] s?c kh?e c th? xc ??nh gi?m bao nhiu cn th t?t nh?t cho qu v? v c th? gip qu v? gi?m cn an ton.  N?u qu v? b? th?a cn ho?c bo ph, qu v? c th? ???c h??ng d?n gi?m t nh?t 5?7 % cn n?ng. R??u v thu?c l   Gi?i h?n l??ng r??u qu v? u?ng khng qu 1 ly m?i ngy v?i ph? n? khng mang thai v 2 ly m?i ngy v?i nam gi?i. M?t ly t??ng ???ng v?i 12 ao-x? bia, 5 ao-x? r??u vang, ho?c 1 ao-x? r??u m?nh.  Khng s? d?ng b?t k? s?n ph?m thu?c l no, ch?ng ha?n thu?c l d?ng ht, thu?c l d?ng nhai v thu?c l  ?i?n t?. N?u qu v? c?n gip ?? ?? cai thu?c, hy h?i chuyn gia ch?m [place] s?c kh?e. Lm vi?c v?i chuyn gia ch?m [place] s?c kh?e  Ki?m tra huy?t p ??nh k? theo h??ng d?n c?a chuyn gia ch?m [place] s?c kh?e.  Th?o lu?n cc y?u t? nguy c? c?a qu v? v cch gi?m nguy c? ti?u ???ng c?a qu v?.  Lm xt nghi?m sng l?c theo ch? d?n c?a chuyn gia ch?m [place] s?c kh?e. Qu v? c th? ???c lm xt nghi?m sng l?c ??nh k?, ??c bi?t l n?u qu v? c m?t s? y?u t? nguy c? b? ti?u ???ng tup 2 nh?t ??nh.  ??t l?ch h?n v?i chuyn gia v? ch? ?? ?n v dinh d??ng (chuyn gia dinh d??ng c gi?y php hnh ngh?). Chuyn gia dinh d??ng c gi?y php hnh ngh? c th? gip qu v? l?p m?t k? ho?ch ?n u?ng lnh m?nh v c th? gip qu v? hi?u v? cc kch c? kh?u ph?n v nhn th?c ph?m. T?i sao nh?ng thay ??i ny l?i quan tr?ng?  C th? phng ng?a ho?c lm ch?m ti?u ???ng tup 2 v cc v?n ?? s?c kh?e lin quan b?ng cch th?c hi?n thay ??i l?i s?ng v dinh d??ng.  C th? kh nh?n ra cc d?u hi?u c?a ti?u ???ng tup 2. Cch t?t nh?t ?? trnh kh? n?ng t?n th??ng c? th? l hnh ??ng ?? ng?n ng?a b?nh tr??c khi qu v? c cc tri?u ch?ng. ?i?u g c th? x?y ra n?u khng thay ??i?  M?c glucose trong mu qu v? c th? t?ng lin t?c. C n?ng ?? glucose trong mu cao trong th?i gian di l r?t nguy hi?m. Qu nhi?u glucose trong mu c?a qu v? c th? gy t?n th??ng cc m?ch mu, tim, th?n, dy th?n kinh v m?t c?a qu v?.  Qu v? c th? b? ti?n ti?u ???ng ho?c ti?u ???ng tup 2. Ti?u ???  ng tup 2 c th? d?n ??n nhi?u v?n ?? s?c kh?e m?n tnh v bi?n ch?ng, ch?ng h?n nh?: ? B?nh tim. ? ??t qu?. ? M. ? B?nh th?n. ? Tr?m c?m. ? Tu?n hon km ? bn chn v c?ng chn, c th? d?n ??n ph?u thu?t tho chn (c?t c?t) ? cc ca n?ng. Tm s? h? tr? ? ?u:  Hy yu c?u chuyn gia ch?m [place] s?c kh?e c?a qu v? gi?i thi?u m?t chuyn gia dinh d??ng c gi?y php hnh ngh?, ng??i h??ng d?n v? ti?u ???ng ho?c ch??ng trnh gi?m cn.  Tm ki?m cc  nhm gi?m cn ? ??a ph??ng v trn m?ng.  Tham gia m?t l?p t?p gym, cu l?c b? th? hnh ho?c nhm ho?t ??ng ngoi tr?i, ch?ng h?n nh? cu l?c b? ?i b?. N?i ?? tm thm thng tin: ?? tm hi?u thm v? cc phng ng?a ti?u ???ng, hy gh:  Hi?p h?i Ti?u ????ng My? (ADA): www.diabetes.org  Vi?n Ti?u ???ng v cc B?nh v? Tiu [place] v Th?n Qu?c Gia: ToyArticles.ca  ?? tm hi?u thm v? cch ?n u?ng lnh m?nh, hy gh:  B? Nng nghi?p [place]? Architect), Choose My Plate: http://yates.biz/  V?n phng Phng ch?ng b?nh t?t v T?ng c??ng S?c kh?e (ODPHP), Dietary Guidelines: ListingMagazine.si  Tm t?t  Qu v? c th? gi?m nguy c? b? ti?u ???ng tup 2 b?ng cch t?ng ho?t ??ng th? ch?t, ?n cc th?c ph?m lnh m?nh v gi?m cn theo ch? d?n.  Hy trao ??i v?i chuyn gia ch?m [place] s?c kh?e v? nguy c? b? ti?u ???ng tup 2 c?a qu v?. Hy h?i v? b?t k? xt nghi?m mu ho?c xt nghi?m sng l?c no m qu v? c?n lm. Thng tin ny khng nh?m m?c ?ch thay th? cho l?i khuyn m chuyn gia ch?m [place] s?c kh?e ni v?i qu v?. Hy b?o ??m qu v? ph?i th?o lu?n b?t k? v?n ?? g m qu v? c v?i chuyn gia ch?m [place] s?c kh?e c?a qu v?. Document Released: 10/02/2016 Document Revised: 10/02/2016 Elsevier Interactive Patient Education  2018 [employer]. Thi?u mu do thi?u s?t, Ng??i l?n (Iron Deficiency Anemia, Adult) Thi?u mu l m?t tnh tr?ng b?nh l c s? l??ng h?ng c?u ho?c hemoglobin trong mu t h?n bnh th??ng. Hemoglobin l m?t ph?n c?a h?ng c?u mang -xy. Thi?u mu do thi?u s?t l thi?u mu do l??ng s?t qu t. ?y l lo?i thi?u mu ph? bi?n nh?t. Thi?u mu do thi?u s?t c th? khi?n qu v? m?t m?i v kh th?. NGUYN NHN.   Thi?u s?t trong ch? ?? ?n u?ng.  Kh? n?ng h?p th? s?t km, nh? th?y trong cc r?i lo?n ???ng ru?t.  Ch?y mu ???ng ru?t.  K? kinh nguy?t n?ng. D?U HI?U V TRI?U CH?NG  Thi?u mu nh? c th? khng ???c bi?t ??n. Tri?u ch?ng c  th? bao g?m:  M?t m?i.  ?au ??u.  Da nh?t nh?t.  Y?u.  M?t m?i.  Kh th?.  Chng m?t.  L?nh tay v chn.  Nh?p tim nhanh ho?c khng ??u. CH?N ?ON  Ch?n ?on c?n ph?i c chuyn gia ch?m [place] s?c kh?e ?nh gi k? v khm th?c th?[place] nghi?m mu th??ng ???c dng ?? xc ??nh thi?u mu do thi?u s?t. Cc xt nghi?m b? sung c th? ???c th?c hi?n ?? tm ra nguyn nhn bn trong c?a tnh tr?ng thi?u mu. Nh?ng xt nghi?m ny c th? bao g?m:  Xt nghi?m tm mu trong  phn (xt nghi?m mu ?n trong phn).  M?t th? thu?t ?? xem bn trong ??i trng v tr?c trng (soi ??i trng)  M?t th? thu?t ?? xem bn trong th?c qu?n v d? dy (n?i soi). ?I?U TR?  Thi?u mu do thi?u s?t ???c ?i?u tr? b?ng cch kh?c ph?c nguyn nhn gy thi?u s?t. Vi?c ?i?u tr? c th? bao g?m:  B? sung th?c ?n giu ch?t s?t vo ch? ?? ?n c?a qu v?.  Dng th?c ph?m b? sung s?t. Ph? n? mang thai ho?c cho con b s? c?n b? sung s?t, v ch? ?? ?n u?ng bnh th??ng c?a h? th??ng khng cung c?p ?? s? l??ng yu c?u.  Dng vitamin Vitamin C gip c?i thi?n kh? n?ng h?p thu s?t. Chuyn gia ch?m [place] s?c kh?e c th? [place] qu v? nn u?ng vin s?t v?i m?t ly n??c cam ho?c th?c ph?m ch?c n?ng c b? sung vitamin C.  Cc lo?i thu?c lm kinh nguy?t ra t mu h?n.  Ph?u thu?t. H??NG D?N CH?M [place] T?I NH   U?ng s?t theo ch? d?n c?a chuyn gia ch?m [place] s?c kh?e. ? N?u qu v? khng th? dng th?c ph?m ch?c n?ng c b? sung s?t theo ???ng u?ng, hy ni v?i chuyn gia ch?m [place] s?c kh?e v? vi?c dng thu?c theo ???ng t?nh m?ch (trong t?nh m?ch) ho?c tim vo c?. ? ?? h?p thu s?t t?t nh?t, nn s? d?ng cc th?c ph?m ch?c n?ng c b? sung s?t khi ?i. N?u qu v? khng th? dung n?p ???c nh?ng ch?t ny khi b?ng ?i, qu v? c th? c?n s? d?ng chng cng v?i th?c ?n. ? Khng u?ng s?a ho?c thu?c lm gi?m a-xt trong d? dy cng m?t lc v?i th?c ph?m ch?c n?ng c b? sung s?t. S?a v thu?c lm gi?m a-xt c th? ?nh h??ng ??n kh? n?ng h?p thu s?t. ? Th?c  ph?m ch?c n?ng c b? sung s?t c th? gy to bn. B?o ??m vi?c s? d?ng ch?t x? trong ch? ?? ?n ?? trnh to bn. M?t thu?c lm m?m phn c?ng c th? ???c khuyn dng.  U?ng vitamin theo ch? d?n c?a chuyn gia ch?m [place] s?c kh?e.  p d?ng ch? ?? ?n u?ng giu ch?t s?t. Cc lo?i th?c ?n giu ch?t s?t bao g?m gan, th?t b n?c, bnh m nguyn h?t, tr?ng, tri cy kh v rau c mu xanh s?m, rau c nhi?u l. NGAY L?P T?C ?I KHM N?U:   Qu v? b? ng?t. N?u tnh tr?ng ny x?y ra, Khng li xe. G?i cho d?ch v? c?p c?u t?i ??a ph??ng (911 ? [place]?) n?u khng c s? tr? gip khc.  Qu v? b? ?au ng?c.  Qu v? c?m th?y bu?n nn ho?c nn.  Qu v? b? kh th? n?ng ho?c gia t?ng cng v?i ho?t ??ng.  Qu v? c?m th?y y?u ?t.  Qu v? c nh?p tim nhanh.  Qu v? b? ?? m? hi khng r nguyn nhn.  Qu v? th?y chong vng khi ra kh?i gh? ho?c gi??ng. ??M B?O QU V?:   Hi?u r cc h??ng d?n ny.  S? theo di tnh tr?ng c?a mnh.  S? yu c?u tr? gip ngay l?p t?c n?u qu v? c?m th?y khng kh?e ho?c th?y tr?m tr?ng h?n. Thng tin ny khng nh?m m?c ?ch thay th? cho l?i khuyn m chuyn gia ch?m [place] s?c kh?e ni v?i qu v?. Hy b?o ??m qu v? ph?i th?o lu?n b?t k?  v?n ?? g m qu v? c v?i chuyn gia ch?m [place] s?c kh?e c?a qu v?. Document Released: 06/16/2005 Document Revised: 07/07/2014 Elsevier Interactive Patient Education  2017 Elsevier Inc. Hemoglobin A1c Test Some of the sugar (glucose) that circulates in your blood sticks or binds to blood proteins. Hemoglobin (Hb or Hgb) is one type of blood protein that glucose binds to. It also carries oxygen in the red blood cells (RBCs). When glucose binds to Hb, the glucose-coated Hb is called glycated Hb. Once Hb is glycated, it remains that way for the life of the RBC. This is about 120 days. Rather than testing your blood glucose level on one single day, the hemoglobin A1c (HbA1c) test measures the average amount of glycated hemoglobin and, therefore, the  average amount of glucose in your blood during the 3-4 months just before the test is done. The HbA1c test is used to monitor long-term control of blood sugar in people who have diabetes mellitus. The HbA1c test can also be used in addition to or in combination with fasting blood glucose level and oral glucose tolerance tests. What do the results mean? It is your responsibility to obtain your test results. Ask the lab or department performing the test when and how you will get your results. Contact your health care provider to discuss any questions you have about your results. Range of Normal Values Ranges for normal values may vary among different labs and hospitals. You should always check with your health care provider after having lab work or other tests done to discuss the meaning of your test results and whether your values are considered within normal limits. The ranges for normal HbA1c test results are as follows:  Adult or child without diabetes: 4-5.9%.  Adult or child with diabetes and good blood glucose control: less than 6.5%.  Several factors can affect HbA1c test results. These may include:  Diseases (hemoglobinopathies) that cause a change in the shape, size, or amount of Hb in your blood.  Longer than normal RBC life span.  Abnormally low levels of certain proteins in your blood.  Eating foods or taking supplements that are high in vitamin C (ascorbic acid).  Meaning of Results Outside Normal Value Ranges Abnormally high HbA1c values are most commonly an indication of prediabetes mellitus and diabetes mellitus:  An HbA1c result of 5.7-6.4% is considered diagnostic of prediabetes mellitus.  An HbA1c result of 6.5% or higher on two separate occasions is considered diagnostic of diabetes mellitus.  Abnormally low HbA1c values can be caused by several health conditions. These may include:  Pregnancy.  A large amount of blood loss.  Blood transfusions.  Low red blood  cell count (anemia). This is caused by premature destruction of red blood cells.  Long-term kidney failure.  Some unusual forms of Hb (Hb variants), such as sickle cell trait.  Discuss your test results with your health care provider. He or she will use the results to make a diagnosis and determine a treatment plan that is right for you. Talk with your health care provider to discuss your results, treatment options, and if necessary, the need for more tests. Talk with your health care provider if you have any questions about your results. This information is not intended to replace advice given to you by your health care provider. Make sure you discuss any questions you have with your health care provider. Document Released: 07/08/2004 Document Revised: 03/12/2016 Document Reviewed: 10/31/2013 Elsevier Interactive Patient Education  2018 [employer]  Preventing Type 2 Diabetes Mellitus Type 2 diabetes (type 2 diabetes mellitus) is a long-term (chronic) disease that affects blood sugar (glucose) levels. Normally, a hormone called insulin allows glucose to enter cells in the body. The cells use glucose for energy. In type 2 diabetes, one or both of these problems may be present:  The body does not make enough insulin.  The body does not respond properly to insulin that it makes (insulin resistance).  Insulin resistance or lack of insulin causes excess glucose to build up in the blood instead of going into cells. As a result, high blood glucose (hyperglycemia) develops, which can cause many complications. Being overweight or obese and having an inactive (sedentary) lifestyle can increase your risk for diabetes. Type 2 diabetes can be delayed or prevented by making certain nutrition and lifestyle changes. What nutrition changes can be made?  Eat healthy meals and snacks regularly. Keep a healthy snack with you for when you get hungry between meals, such as fruit or a handful of nuts.  Eat lean  meats and proteins that are low in saturated fats, such as chicken, fish, egg whites, and beans. Avoid processed meats.  Eat plenty of fruits and vegetables and plenty of grains that have not been processed (whole grains). It is recommended that you eat: ? 1?2 cups of fruit every day. ? 2?3 cups of vegetables every day. ? 6?8 oz of whole grains every day, such as oats, whole wheat, bulgur, brown rice, quinoa, and millet.  Eat low-fat dairy products, such as milk, yogurt, and cheese.  Eat foods that contain healthy fats, such as nuts, avocado, olive oil, and canola oil.  Drink water throughout the day. Avoid drinks that contain added sugar, such as soda or sweet tea.  Follow instructions from your health care provider about specific eating or drinking restrictions.  Control how much food you eat at a time (portion size). ? Check food labels to find out the serving sizes of foods. ? Use a kitchen scale to weigh amounts of foods.  Saute or steam food instead of frying it. Cook with water or broth instead of oils or butter.  Limit your intake of: ? Salt (sodium). Have no more than 1 tsp (2,400 mg) of sodium a day. If you have heart disease or high blood pressure, have less than ? tsp (1,500 mg) of sodium a day. ? Saturated fat. This is fat that is solid at room temperature, such as butter or fat on meat. What lifestyle changes can be made?  Activity  Do moderate-intensity physical activity for at least 30 minutes on at least 5 days of the week, or as much as told by your health care provider.  Ask your health care provider what activities are safe for you. A mix of physical activities may be best, such as walking, swimming, cycling, and strength training.  Try to add physical activity into your day. For example: ? Park in spots that are farther away than usual, so that you walk more. For example, park in a far corner of the parking lot when you go to the office or the grocery  store. ? Take a walk during your lunch break. ? Use stairs instead of elevators or escalators. Weight Loss  Lose weight as directed. Your health care provider can determine how much weight loss is best for you and can help you lose weight safely.  If you are overweight or obese, you may be instructed to lose at least 5?7 %  of your body weight. Alcohol and Tobacco   Limit alcohol intake to no more than 1 drink a day for nonpregnant women and 2 drinks a day for men. One drink equals 12 oz of beer, 5 oz of wine, or 1 oz of hard liquor.  Do not use any tobacco products, such as cigarettes, chewing tobacco, and e-cigarettes. If you need help quitting, ask your health care provider. Work With Your Health Care Provider  Have your blood glucose tested regularly, as told by your health care provider.  Discuss your risk factors and how you can reduce your risk for diabetes.  Get screening tests as told by your health care provider. You may have screening tests regularly, especially if you have certain risk factors for type 2 diabetes.  Make an appointment with a diet and nutrition specialist (registered dietitian). A registered dietitian can help you make a healthy eating plan and can help you understand portion sizes and food labels. Why are these changes important?  It is possible to prevent or delay type 2 diabetes and related health problems by making lifestyle and nutrition changes.  It can be difficult to recognize signs of type 2 diabetes. The best way to avoid possible damage to your body is to take actions to prevent the disease before you develop symptoms. What can happen if changes are not made?  Your blood glucose levels may keep increasing. Having high blood glucose for a long time is dangerous. Too much glucose in your blood can damage your blood vessels, heart, kidneys, nerves, and eyes.  You may develop prediabetes or type 2 diabetes. Type 2 diabetes can lead to many chronic  health problems and complications, such as: ? Heart disease. ? Stroke. ? Blindness. ? Kidney disease. ? Depression. ? Poor circulation in the feet and legs, which could lead to surgical removal (amputation) in severe cases. Where to find support:  Ask your health care provider to recommend a registered dietitian, diabetes educator, or weight loss program.  Look for local or online weight loss groups.  Join a gym, fitness club, or outdoor activity group, such as a walking club. Where to find more information: To learn more about diabetes and diabetes prevention, visit:  American Diabetes Association (ADA): www.diabetes.[employer] of Diabetes and Digestive and Kidney Diseases: ToyArticles.ca  To learn more about healthy eating, visit:  The U.[place]. Department [employer] Architect), Choose My Plate: http://yates.biz/  Office of Disease Prevention and Health Promotion (ODPHP), Dietary Guidelines: ListingMagazine.si  Summary  You can reduce your risk for type 2 diabetes by increasing your physical activity, eating healthy foods, and losing weight as directed.  Talk with your health care provider about your risk for type 2 diabetes. Ask about any blood tests or screening tests that you need to have. This information is not intended to replace advice given to you by your health care provider. Make sure you discuss any questions you have with your health care provider. Document Released: 10/08/2015 Document Revised: 11/22/2015 Document Reviewed: 08/07/2015 Elsevier Interactive Patient Education  2018 [employer].  Preventing Type 2 Diabetes Mellitus Type 2 diabetes (type 2 diabetes mellitus) is a long-term (chronic) disease that affects blood sugar (glucose) levels. Normally, a hormone called insulin allows glucose to enter cells in the body. The cells use glucose for energy. In type 2 diabetes, one or both of these  problems may be present:  The body does not make enough insulin.  The body does not respond properly to  insulin that it makes (insulin resistance).  Insulin resistance or lack of insulin causes excess glucose to build up in the blood instead of going into cells. As a result, high blood glucose (hyperglycemia) develops, which can cause many complications. Being overweight or obese and having an inactive (sedentary) lifestyle can increase your risk for diabetes. Type 2 diabetes can be delayed or prevented by making certain nutrition and lifestyle changes. What nutrition changes can be made?  Eat healthy meals and snacks regularly. Keep a healthy snack with you for when you get hungry between meals, such as fruit or a handful of nuts.  Eat lean meats and proteins that are low in saturated fats, such as chicken, fish, egg whites, and beans. Avoid processed meats.  Eat plenty of fruits and vegetables and plenty of grains that have not been processed (whole grains). It is recommended that you eat: ? 1?2 cups of fruit every day. ? 2?3 cups of vegetables every day. ? 6?8 oz of whole grains every day, such as oats, whole wheat, bulgur, brown rice, quinoa, and millet.  Eat low-fat dairy products, such as milk, yogurt, and cheese.  Eat foods that contain healthy fats, such as nuts, avocado, olive oil, and canola oil.  Drink water throughout the day. Avoid drinks that contain added sugar, such as soda or sweet tea.  Follow instructions from your health care provider about specific eating or drinking restrictions.  Control how much food you eat at a time (portion size). ? Check food labels to find out the serving sizes of foods. ? Use a kitchen scale to weigh amounts of foods.  Saute or steam food instead of frying it. Cook with water or broth instead of oils or butter.  Limit your intake of: ? Salt (sodium). Have no more than 1 tsp (2,400 mg) of sodium a day. If you have heart disease or high  blood pressure, have less than ? tsp (1,500 mg) of sodium a day. ? Saturated fat. This is fat that is solid at room temperature, such as butter or fat on meat. What lifestyle changes can be made?  Activity  Do moderate-intensity physical activity for at least 30 minutes on at least 5 days of the week, or as much as told by your health care provider.  Ask your health care provider what activities are safe for you. A mix of physical activities may be best, such as walking, swimming, cycling, and strength training.  Try to add physical activity into your day. For example: ? Park in spots that are farther away than usual, so that you walk more. For example, park in a far corner of the parking lot when you go to the office or the grocery store. ? Take a walk during your lunch break. ? Use stairs instead of elevators or escalators. Weight Loss  Lose weight as directed. Your health care provider can determine how much weight loss is best for you and can help you lose weight safely.  If you are overweight or obese, you may be instructed to lose at least 5?7 % of your body weight. Alcohol and Tobacco   Limit alcohol intake to no more than 1 drink a day for nonpregnant women and 2 drinks a day for men. One drink equals 12 oz of beer, 5 oz of wine, or 1 oz of hard liquor.  Do not use any tobacco products, such as cigarettes, chewing tobacco, and e-cigarettes. If you need help quitting, ask your health care provider.  Work With Your Health Care Provider  Have your blood glucose tested regularly, as told by your health care provider.  Discuss your risk factors and how you can reduce your risk for diabetes.  Get screening tests as told by your health care provider. You may have screening tests regularly, especially if you have certain risk factors for type 2 diabetes.  Make an appointment with a diet and nutrition specialist (registered dietitian). A registered dietitian can help you make a  healthy eating plan and can help you understand portion sizes and food labels. Why are these changes important?  It is possible to prevent or delay type 2 diabetes and related health problems by making lifestyle and nutrition changes.  It can be difficult to recognize signs of type 2 diabetes. The best way to avoid possible damage to your body is to take actions to prevent the disease before you develop symptoms. What can happen if changes are not made?  Your blood glucose levels may keep increasing. Having high blood glucose for a long time is dangerous. Too much glucose in your blood can damage your blood vessels, heart, kidneys, nerves, and eyes.  You may develop prediabetes or type 2 diabetes. Type 2 diabetes can lead to many chronic health problems and complications, such as: ? Heart disease. ? Stroke. ? Blindness. ? Kidney disease. ? Depression. ? Poor circulation in the feet and legs, which could lead to surgical removal (amputation) in severe cases. Where to find support:  Ask your health care provider to recommend a registered dietitian, diabetes educator, or weight loss program.  Look for local or online weight loss groups.  Join a gym, fitness club, or outdoor activity group, such as a walking club. Where to find more information: To learn more about diabetes and diabetes prevention, visit:  American Diabetes Association (ADA): www.diabetes.[employer] of Diabetes and Digestive and Kidney Diseases: ToyArticles.ca  To learn more about healthy eating, visit:  The U.S. Department of Agriculture Architect), Choose My Plate: http://yates.biz/  Office of Disease Prevention and Health Promotion (ODPHP), Dietary Guidelines: ListingMagazine.si  Summary  You can reduce your risk for type 2 diabetes by increasing your physical activity, eating healthy foods, and losing weight as directed.  Talk with your  health care provider about your risk for type 2 diabetes. Ask about any blood tests or screening tests that you need to have. This information is not intended to replace advice given to you by your health care provider. Make sure you discuss any questions you have with your health care provider. Document Released: 10/08/2015 Document Revised: 11/22/2015 Document Reviewed: 08/07/2015 Elsevier Interactive Patient Education  [employer].

## 2018-02-19 NOTE — Progress Notes (Signed)
Subjective:    Patient ID: Misty Singh, female    DOB: 01/04/1971, 47 y.o.   MRN: [id number] Due to a language barrier, patient's daughter served as her interpreter. Patient and daughter declined video interpreting service.   HPI  47 yo female who is seen in follow-up of anemia and vitamin D deficiency. Patient also has a concern regarding diabetes. Daughter reports that patient's husband was diagnosed with diabetes and had a stroke earlier this year and patient would like to be checked for diabetes. Patient has had mild fatigue but otherwise she has been well.  Patient states that when she finished the prescribed vitamin D and ferrous sulfate, she did not get any refills and therefore was not taking a vitamin D supplement or an iron supplement at this time.  Daughter is curious about recommended over-the-counter doses of vitamin D.  Patient denies any headache or dizziness, no shortness of breath or cough.  Patient denies any abdominal pain, no chest pain and no urinary symptoms.  Patient has had no significant past medical history other than her anemia and vitamin D deficiency.  Past surgical history is significant for tubal ligation.  Patient and daughter do not know a lot about the family history as most of their family members are still in [place].  No Known Allergies   Review of Systems  Constitutional: Positive for fatigue. Negative for chills and fever.  HENT: Negative for dental problem and trouble swallowing.   Respiratory: Negative for cough and shortness of breath.   Cardiovascular: Negative for chest pain and palpitations.  Gastrointestinal: Negative for abdominal pain, constipation, diarrhea and nausea.  Endocrine: Negative for polydipsia, polyphagia and polyuria.  Genitourinary: Negative for dysuria and frequency.  Musculoskeletal: Negative for back pain and gait problem.  Neurological: Negative for dizziness and headaches.       Objective:   Physical Exam BP 120/82   Pulse  87   Temp 99 F (37.2 C) (Oral)   Resp 18   Ht 5\' 1"  (1.549 m)   Wt 132 lb (59.9 kg)   LMP 02/14/2018   SpO2 98%   BMI 24.94 kg/m  Vital signs reviewed General-well-nourished, well-developed older female in no acute distress.  Patient is accompanied by her daughter at today's visit. ENT- TMs gray, normal intranasal exam and normal oropharynx. Neck-supple, no lymphadenopathy, no thyromegaly, no carotid bruit Lungs-clear to auscultation bilaterally. Cardiovascular-regular rate rhythm Abdomen-soft, nontender Back-no CVA tenderness Extremities-no edema       Assessment & Plan:  1. Iron deficiency anemia, unspecified iron deficiency anemia type Patient with a history of iron deficiency anemia.  Patient will have CBC at today's visit and will be notified if further iron therapy as needed based on results - CBC with Differential  2. Vitamin D deficiency Patient with vitamin D deficiency.  Educational material was provided regarding vitamin D deficiency.  Patient/daughter will be notified regarding the need for prescription vitamin D.  Also encouraged this patient is perimenopausal that if vitamin D level is near normal that vitamin D at thousand units daily will still be recommended to help with osteoporosis prevention. - Vitamin D, 25-hydroxy  3. Fatigue, unspecified type Patient with complaint of some fatigue and would like to be checked for diabetes.  Patient has had past hemoglobin A1c in March 2018 of 5.5.  Patient's A1c at today's visit is borderline for prediabetes as A1c is 5.7 which meets current guidelines for prediabetes.  Patient information provided via after visit summary regarding healthy  diet and exercise to prevent diabetes.  Recommend six-month repeat of hemoglobin A1c but sooner if patient develops increased fatigue, urinary frequency, increased thirst, increased hunger, visual disturbance or any concerns. - HgB A1c  - Influenza immunization offered at today's visit  which patient declined - After visit summary was provided for the patient at the conclusion of today's visit  Return in about 6 months (around 08/22/2018) for chronic issues.

## 2018-02-20 LAB — CBC WITH DIFFERENTIAL/PLATELET
Basophils Absolute: 0.1 x10E3/uL (ref 0.0–0.2)
Basos: 1 %
EOS (ABSOLUTE): 1.5 x10E3/uL — ABNORMAL HIGH (ref 0.0–0.4)
Eos: 18 %
Hematocrit: 37.9 % (ref 34.0–46.6)
Hemoglobin: 11.1 g/dL (ref 11.1–15.9)
Immature Grans (Abs): 0 x10E3/uL (ref 0.0–0.1)
Immature Granulocytes: 0 %
Lymphocytes Absolute: 2.4 x10E3/uL (ref 0.7–3.1)
Lymphs: 28 %
MCH: 22.3 pg — ABNORMAL LOW (ref 26.6–33.0)
MCHC: 29.3 g/dL — ABNORMAL LOW (ref 31.5–35.7)
MCV: 76 fL — ABNORMAL LOW (ref 79–97)
Monocytes Absolute: 0.5 x10E3/uL (ref 0.1–0.9)
Monocytes: 6 %
Neutrophils Absolute: 4 x10E3/uL (ref 1.4–7.0)
Neutrophils: 47 %
Platelets: 393 x10E3/uL (ref 150–450)
RBC: 4.97 x10E6/uL (ref 3.77–5.28)
RDW: 15 % (ref 12.3–15.4)
WBC: 8.3 x10E3/uL (ref 3.4–10.8)

## 2018-02-20 LAB — VITAMIN D 25 HYDROXY (VIT D DEFICIENCY, FRACTURES): Vit D, 25-Hydroxy: 18.3 ng/mL — ABNORMAL LOW (ref 30.0–100.0)

## 2018-02-24 ENCOUNTER — Telehealth: Payer: Self-pay

## 2018-02-24 NOTE — Telephone Encounter (Signed)
-----   Message from Cain Saupeammie Fulp, MD sent at 02/22/2018  6:34 PM EDT ----- Hemoglobin is normal but I would still suggest a daily multivitamin  multivitamin with iron at this time. Also, the vitamin D level is low at 18.3 (last year was 22.6). Please ask daughter if her mother would like a prescription for Vit D to take once per week called in or if patient wants to take otc Vit D at 1,000 units daily and recheck level in 5 months.

## 2018-02-24 NOTE — Telephone Encounter (Signed)
[employer] Marin Commentzra XB#[id number] Patient called no answer, lvm to return call.  Hemoglobin is normal but I would still suggest a daily multivitamin  multivitamin with iron at this time. Also, the vitamin D level is low at 18.3 (last year was 22.6). Please ask daughter if her mother would like a prescription for Vit D to take once per week called in or if patient wants to take otc Vit D at 1,000 units daily and recheck level in 5 months

## 2018-03-04 ENCOUNTER — Telehealth: Payer: Self-pay | Admitting: Family Medicine

## 2018-03-04 NOTE — Telephone Encounter (Signed)
Patients daughter called back to get lab results. Please follow up with daughter at [phone].

## 2019-04-08 ENCOUNTER — Other Ambulatory Visit: Payer: Self-pay

## 2019-04-08 ENCOUNTER — Encounter: Payer: Self-pay | Admitting: Internal Medicine

## 2019-04-08 ENCOUNTER — Ambulatory Visit: Payer: Medicaid Other | Attending: Internal Medicine | Admitting: Internal Medicine

## 2019-04-08 VITALS — BP 119/79 | HR 80 | Temp 98.2°F | Resp 18 | Ht 61.0 in | Wt 127.0 lb

## 2019-04-08 DIAGNOSIS — R7303 Prediabetes: Secondary | ICD-10-CM

## 2019-04-08 DIAGNOSIS — Z1231 Encounter for screening mammogram for malignant neoplasm of breast: Secondary | ICD-10-CM

## 2019-04-08 DIAGNOSIS — R5383 Other fatigue: Secondary | ICD-10-CM | POA: Insufficient documentation

## 2019-04-08 DIAGNOSIS — E559 Vitamin D deficiency, unspecified: Secondary | ICD-10-CM

## 2019-04-08 DIAGNOSIS — D509 Iron deficiency anemia, unspecified: Secondary | ICD-10-CM

## 2019-04-08 DIAGNOSIS — Z23 Encounter for immunization: Secondary | ICD-10-CM

## 2019-04-08 NOTE — Progress Notes (Signed)
Patient ID: IPEK WESTRA, female    DOB: 02/15/1971  MRN: [id number]  CC: Establish Care   Subjective: Misty Singh is a 48 y.o. female who presents for chronic ds management Daughter, Daleen Snook, is with her and interprets Her concerns today include:  Vit D def, IDA, fatigue, preDM  Hx of iron def but last CBC 1 yr ago was good.  She has not been taking any iron supplement.  She denies any fatigue or dizziness  Hx of Vit D def. Out of Vit D x 11 mths  PreDM on blood test done 01/2019.  Pt and daughter said they were not aware. Over all feels she eats healthy.  She incorporates fresh fruits and veggies She works in her garden and mows the lawn once a wk Sleeping well Moving bowels okay.   Patient Active Problem List   Diagnosis Date Noted  . History of iron deficiency anemia 01/05/2017  . History of vitamin D deficiency 01/05/2017     Current Outpatient Medications on File Prior to Visit  Medication Sig Dispense Refill  . ferrous sulfate 325 (65 FE) MG tablet Take 1 tablet (325 mg total) by mouth 2 (two) times daily with a meal. (Patient not taking: Reported on 02/19/2018) 60 tablet 2  . Vitamin D, Ergocalciferol, (DRISDOL) 50000 units CAPS capsule TAKE 1 CAPSULE BY MOUTH ONCE A WEEK. (Patient not taking: Reported on 02/19/2018) 12 capsule 0   No current facility-administered medications on file prior to visit.     No Known Allergies  Social History   Socioeconomic History  . Marital status: Single    Spouse name: Not on file  . Number of children: Not on file  . Years of education: Not on file  . Highest education level: Not on file  Occupational History  . Not on file  Social Needs  . Financial resource strain: Not on file  . Food insecurity    Worry: Not on file    Inability: Not on file  . Transportation needs    Medical: Not on file    Non-medical: Not on file  Tobacco Use  . Smoking status: Never Smoker  . Smokeless tobacco: Never Used  Substance and Sexual Activity   . Alcohol use: No  . Drug use: No  . Sexual activity: Not Currently  Lifestyle  . Physical activity    Days per week: Not on file    Minutes per session: Not on file  . Stress: Not on file  Relationships  . Social Herbalist on phone: Not on file    Gets together: Not on file    Attends religious service: Not on file    Active member of club or organization: Not on file    Attends meetings of clubs or organizations: Not on file    Relationship status: Not on file  . Intimate partner violence    Fear of current or ex partner: Not on file    Emotionally abused: Not on file    Physically abused: Not on file    Forced sexual activity: Not on file  Other Topics Concern  . Not on file  Social History Narrative  . Not on file    History reviewed. No pertinent family history.  History reviewed. No pertinent surgical history.  ROS: Review of Systems Negative except as stated above  PHYSICAL EXAM: BP 119/79 (BP Location: Right Arm, Patient Position: Sitting, Cuff Size: Normal)   Pulse 80  Temp 98.2 F (36.8 C) (Oral)   Resp 18   Ht 5\' 1"  (1.549 m)   Wt 127 lb (57.6 kg)   LMP 04/08/2019   SpO2 100%   BMI 24.00 kg/m   Wt Readings from Last 3 Encounters:  04/08/19 127 lb (57.6 kg)  02/19/18 132 lb (59.9 kg)  01/05/17 125 lb 3.2 oz (56.8 kg)    Physical Exam  General appearance - alert, well appearing, and in no distress Mental status - normal mood, behavior, speech, dress, motor activity, and thought processes Neck - supple, no significant adenopathy Chest - clear to auscultation, no wheezes, rales or rhonchi, symmetric air entry Heart - normal rate, regular rhythm, normal S1, S2, no murmurs, rubs, clicks or gallops Abdomen - soft, nontender, nondistended, no masses or organomegaly Extremities - peripheral pulses normal, no pedal edema, no clubbing or cyanosis   CMP Latest Ref Rng & Units 09/22/2016  Glucose 65 - 99 mg/dL 99  BUN 6 - 24 mg/dL 9   Creatinine 09/24/2016 - 9.83 mg/dL 3.82)  Sodium 5.05(L - 976 mmol/L 139  Potassium 3.5 - 5.2 mmol/L 4.4  Chloride 96 - 106 mmol/L 98  CO2 18 - 29 mmol/L 23  Calcium 8.7 - 10.2 mg/dL 9.4  Total Protein 6.0 - 8.5 g/dL 7.9  Total Bilirubin 0.0 - 1.2 mg/dL 0.5  Alkaline Phos 39 - 117 IU/L 62  AST 0 - 40 IU/L 19  ALT 0 - 32 IU/L 17   Lipid Panel     Component Value Date/Time   CHOL 135 09/22/2016 0915   TRIG 117 09/22/2016 0915   HDL 48 09/22/2016 0915   CHOLHDL 2.8 09/22/2016 0915   LDLCALC 64 09/22/2016 0915    CBC    Component Value Date/Time   WBC 8.3 02/19/2018 1548   RBC 4.97 02/19/2018 1548   HGB 11.1 02/19/2018 1548   HCT 37.9 02/19/2018 1548   PLT 393 02/19/2018 1548   MCV 76 (L) 02/19/2018 1548   MCH 22.3 (L) 02/19/2018 1548   MCHC 29.3 (L) 02/19/2018 1548   RDW 15.0 02/19/2018 1548   LYMPHSABS 2.4 02/19/2018 1548   EOSABS 1.5 (H) 02/19/2018 1548   BASOSABS 0.1 02/19/2018 1548    ASSESSMENT AND PLAN:  1. Prediabetes Commended her on healthy eating habits.  Encouraged her to continue to remain active. - CBC - Comprehensive metabolic panel - Lipid panel - Hemoglobin A1c  2. Vitamin D deficiency - VITAMIN D 25 Hydroxy (Vit-D Deficiency, Fractures)  3. Iron deficiency anemia, unspecified iron deficiency anemia type We will recheck CBC today  4. Encounter for screening mammogram for malignant neoplasm of breast She is never had a mammogram.  She will be 49 come 1 January.  She is agreeable to having mammogram done for screening - MM Digital Screening; Future  5. Need for influenza vaccination Given    Patient was given the opportunity to ask questions.  Patient verbalized understanding of the plan and was able to repeat key elements of the plan.   Orders Placed This Encounter  Procedures  . Flu Vaccine QUAD 6+ mos PF IM (Fluarix Quad PF)     Requested Prescriptions    No prescriptions requested or ordered in this encounter    No follow-ups on  file.  February, MD, FACP

## 2019-04-09 LAB — CBC
Hematocrit: 36.3 % (ref 34.0–46.6)
Hemoglobin: 11.3 g/dL (ref 11.1–15.9)
MCH: 22.6 pg — ABNORMAL LOW (ref 26.6–33.0)
MCHC: 31.1 g/dL — ABNORMAL LOW (ref 31.5–35.7)
MCV: 73 fL — ABNORMAL LOW (ref 79–97)
Platelets: 389 10*3/uL (ref 150–450)
RBC: 5.01 x10E6/uL (ref 3.77–5.28)
RDW: 14.5 % (ref 11.7–15.4)
WBC: 9 10*3/uL (ref 3.4–10.8)

## 2019-04-09 LAB — LIPID PANEL
Chol/HDL Ratio: 3.2 ratio (ref 0.0–4.4)
Cholesterol, Total: 160 mg/dL (ref 100–199)
HDL: 50 mg/dL (ref >39)
LDL Chol Calc (NIH): 89 mg/dL (ref 0–99)
Triglycerides: 119 mg/dL (ref 0–149)
VLDL Cholesterol Cal: 21 mg/dL (ref 5–40)

## 2019-04-09 LAB — VITAMIN D 25 HYDROXY (VIT D DEFICIENCY, FRACTURES): Vit D, 25-Hydroxy: 21.4 ng/mL — ABNORMAL LOW (ref 30.0–100.0)

## 2019-04-09 LAB — COMPREHENSIVE METABOLIC PANEL
ALT: 12 IU/L (ref 0–32)
AST: 18 IU/L (ref 0–40)
Albumin/Globulin Ratio: 1.5 (ref 1.2–2.2)
Albumin: 4.9 g/dL — ABNORMAL HIGH (ref 3.8–4.8)
Alkaline Phosphatase: 79 IU/L (ref 39–117)
BUN/Creatinine Ratio: 13 (ref 9–23)
BUN: 8 mg/dL (ref 6–24)
Bilirubin Total: 0.3 mg/dL (ref 0.0–1.2)
CO2: 23 mmol/L (ref 20–29)
Calcium: 9.6 mg/dL (ref 8.7–10.2)
Chloride: 102 mmol/L (ref 96–106)
Creatinine, Ser: 0.61 mg/dL (ref 0.57–1.00)
GFR calc Af Amer: 123 mL/min/{1.73_m2} (ref >59)
GFR calc non Af Amer: 107 mL/min/{1.73_m2} (ref >59)
Globulin, Total: 3.3 g/dL (ref 1.5–4.5)
Glucose: 95 mg/dL (ref 65–99)
Potassium: 4.8 mmol/L (ref 3.5–5.2)
Sodium: 139 mmol/L (ref 134–144)
Total Protein: 8.2 g/dL (ref 6.0–8.5)

## 2019-04-09 LAB — HEMOGLOBIN A1C
Est. average glucose Bld gHb Est-mCnc: 117 mg/dL
Hgb A1c MFr Bld: 5.7 % — ABNORMAL HIGH (ref 4.8–5.6)

## 2019-04-11 ENCOUNTER — Telehealth: Payer: Self-pay | Admitting: *Deleted

## 2019-04-11 NOTE — Telephone Encounter (Signed)
-----   Message from Ladell Pier, MD sent at 04/09/2019  2:10 PM EDT ----- Please let pt's daughter know that her kidney and LFTs are nl.  Blood count is good.  Vit D level is better but still low.  Please purchase Vit D 400 IU OTC and take 2 daily.  Still in the range for pre-DM.  Healthy eating habits and regular exercise will help prevent progression.

## 2019-04-11 NOTE — Telephone Encounter (Signed)
Patient verified DOB Patients daughter is aware of labs being normal. Vitamin d level is improved but is still low. Patient needs to take OTC 400 IU 2 tablets daily. Patient also advised to implement healthy eating and regular exercise to address preDM range.

## 2019-05-20 ENCOUNTER — Other Ambulatory Visit: Payer: Self-pay | Admitting: Internal Medicine

## 2019-05-20 DIAGNOSIS — Z1231 Encounter for screening mammogram for malignant neoplasm of breast: Secondary | ICD-10-CM

## 2019-07-14 ENCOUNTER — Ambulatory Visit: Payer: Medicaid Other

## 2019-07-15 ENCOUNTER — Ambulatory Visit: Payer: Medicaid Other

## 2019-08-24 ENCOUNTER — Ambulatory Visit
Admission: RE | Admit: 2019-08-24 | Discharge: 2019-08-24 | Disposition: A | Discharge status: Home / Self Care | Payer: Medicaid Other | Source: Ambulatory Visit | Attending: Internal Medicine | Admitting: Internal Medicine

## 2019-08-24 ENCOUNTER — Other Ambulatory Visit: Payer: Self-pay

## 2019-08-24 DIAGNOSIS — Z1231 Encounter for screening mammogram for malignant neoplasm of breast: Secondary | ICD-10-CM | POA: Diagnosis not present

## 2019-11-08 ENCOUNTER — Other Ambulatory Visit: Payer: Self-pay

## 2019-11-08 ENCOUNTER — Ambulatory Visit (HOSPITAL_COMMUNITY)
Admission: EM | Admit: 2019-11-08 | Discharge: 2019-11-08 | Disposition: A | Discharge status: Home / Self Care | Payer: Medicaid Other | Attending: Family Medicine | Admitting: Family Medicine

## 2019-11-08 ENCOUNTER — Encounter (HOSPITAL_COMMUNITY): Payer: Self-pay

## 2019-11-08 DIAGNOSIS — M7582 Other shoulder lesions, left shoulder: Secondary | ICD-10-CM | POA: Diagnosis not present

## 2019-11-08 DIAGNOSIS — M25512 Pain in left shoulder: Secondary | ICD-10-CM | POA: Diagnosis not present

## 2019-11-08 MED ORDER — METHYLPREDNISOLONE 4 MG PO TBPK: ORAL_TABLET | ORAL | 0 refills | Status: DC

## 2019-11-08 NOTE — ED Triage Notes (Signed)
Pt c/o bilateral shoulder pain. States left has been hurting for approx 3 weeks, and right shoulder for approx 3-4 days. Pt states it's difficulty to dress/undress and per ADL's 2/2 pain with movement. Denies CP, back pain, SOB.

## 2019-11-08 NOTE — ED Provider Notes (Signed)
[facility]    CSN: [id number] Arrival date & time: 11/08/19  0827      History   Chief Complaint Chief Complaint  Patient presents with  . Shoulder Pain    HPI Misty Singh is a 49 y.o. female.   HPI  Patient is here for bilateral shoulder pain.  She states she has been having left shoulder pain for the last couple of weeks.  She is right-handed.  She denies any accident or fall, no overuse or activity that might have injured her shoulder.  No prior history of any shoulder pain.  No other joints are bothering her.  She thinks because her left shoulder is been hurting she has been using her right shoulder more.  For the last couple of days her right shoulder has been bothering her as well.  She has pain with certain movements, overhead reaching. Patient is in good health and on no medications at this time No systemic symptoms, fever chills, body aches, headache She does not have pain at night She does not have morning stiffness its worse than her daytime pain History reviewed. No pertinent past medical history.  Patient Active Problem List   Diagnosis Date Noted  . History of iron deficiency anemia 01/05/2017  . History of vitamin D deficiency 01/05/2017    Past Surgical History:  Procedure Laterality Date  . TUBAL LIGATION      OB History   No obstetric history on file.      Home Medications    Prior to Admission medications   Medication Sig Start Date End Date Taking? Authorizing Provider  methylPREDNISolone (MEDROL DOSEPAK) 4 MG TBPK tablet tad 11/08/19   Eustace Moore, MD  ferrous sulfate 325 (65 FE) MG tablet Take 1 tablet (325 mg total) by mouth 2 (two) times daily with a meal. Patient not taking: Reported on 02/19/2018 09/30/16 11/08/19  Lizbeth Bark, FNP    Family History Family History  Family history unknown: Yes    Social History Social History   Tobacco Use  . Smoking status: Never Smoker  . Smokeless tobacco: Never Used    Substance Use Topics  . Alcohol use: No  . Drug use: No     Allergies   Patient has no known allergies.   Review of Systems Review of Systems  Musculoskeletal: Positive for arthralgias.     Physical Exam Triage Vital Signs ED Triage Vitals [11/08/19 0904]  Enc Vitals Group     BP 120/79     Pulse Rate 74     Resp 16     Temp 97.9 F (36.6 C)     Temp Source Oral     SpO2 100 %     Weight      Height      Head Circumference      Peak Flow      Pain Score      Pain Loc      Pain Edu?      Excl. in GC?    No data found.  Updated Vital Signs BP 120/79 (BP Location: Right Arm)   Pulse 74   Temp 97.9 F (36.6 C) (Oral)   Resp 16   LMP 04/08/2019   SpO2 100%      Physical Exam Constitutional:      General: She is not in acute distress.    Appearance: She is well-developed and normal weight.     Comments: Daughter is here who interprets.  No acute distress  HENT:     Head: Normocephalic and atraumatic.     Mouth/Throat:     Comments: Mask is in place Eyes:     Conjunctiva/sclera: Conjunctivae normal.     Pupils: Pupils are equal, round, and reactive to light.  Neck:     Comments: No tenderness in neck or trapezius muscles.  Full range of motion of neck Cardiovascular:     Rate and Rhythm: Normal rate.  Pulmonary:     Effort: Pulmonary effort is normal. No respiratory distress.  Musculoskeletal:        General: Normal range of motion.     Cervical back: Normal range of motion.     Comments: No tenderness about the left shoulder.  Patient can abduct to 90 degrees.  She lacks internal and internal rotation.  No weakness  Skin:    General: Skin is warm and dry.  Neurological:     General: No focal deficit present.     Mental Status: She is alert.  Psychiatric:        Mood and Affect: Mood normal.        Behavior: Behavior normal.      UC Treatments / Results  Labs (all labs ordered are listed, but only abnormal results are displayed) Labs  Reviewed - No data to display  EKG   Radiology No results found.  Procedures Procedures (including critical care time)  Medications Ordered in UC Medications - No data to display  Initial Impression / Assessment and Plan / UC Course  I have reviewed the triage vital signs and the nursing notes.  Pertinent labs & imaging results that were available during my care of the patient were reviewed by me and considered in my medical decision making (see chart for details).     With bilateral shoulder pain I entertain the possibility of PMR.  Without morning stiffness, and with classic rotary pain I believe this is more rotator cuff tendinopathy.  We will treat accordingly. Final Clinical Impressions(s) / UC Diagnoses   Final diagnoses:  Acute pain of left shoulder  Tendinitis of left rotator cuff     Discharge Instructions     Take the medrol dosepak as directed Take all of day one today After this take aleve ( OTC) 2 x a day if needed Followup with your primary care   ED Prescriptions    Medication Sig Dispense Auth. Provider   methylPREDNISolone (MEDROL DOSEPAK) 4 MG TBPK tablet tad 21 tablet Raylene Everts, MD     PDMP not reviewed this encounter.   Raylene Everts, MD 11/08/19 1013

## 2019-11-08 NOTE — Discharge Instructions (Addendum)
Take the medrol dosepak as directed Take all of day one today After this take aleve ( OTC) 2 x a day if needed Followup with your primary care

## 2020-03-13 ENCOUNTER — Encounter: Payer: Self-pay | Admitting: Internal Medicine

## 2020-03-13 ENCOUNTER — Ambulatory Visit (HOSPITAL_BASED_OUTPATIENT_CLINIC_OR_DEPARTMENT_OTHER): Payer: Medicaid Other | Admitting: Pharmacist

## 2020-03-13 ENCOUNTER — Other Ambulatory Visit: Payer: Self-pay

## 2020-03-13 ENCOUNTER — Ambulatory Visit: Payer: Medicaid Other | Attending: Internal Medicine | Admitting: Internal Medicine

## 2020-03-13 VITALS — BP 122/80 | HR 77 | Temp 98.3°F | Resp 16 | Wt 117.8 lb

## 2020-03-13 DIAGNOSIS — R7303 Prediabetes: Secondary | ICD-10-CM

## 2020-03-13 DIAGNOSIS — G8929 Other chronic pain: Secondary | ICD-10-CM | POA: Diagnosis not present

## 2020-03-13 DIAGNOSIS — M25511 Pain in right shoulder: Secondary | ICD-10-CM

## 2020-03-13 DIAGNOSIS — M25512 Pain in left shoulder: Secondary | ICD-10-CM | POA: Insufficient documentation

## 2020-03-13 DIAGNOSIS — E559 Vitamin D deficiency, unspecified: Secondary | ICD-10-CM

## 2020-03-13 DIAGNOSIS — Z1211 Encounter for screening for malignant neoplasm of colon: Secondary | ICD-10-CM | POA: Diagnosis not present

## 2020-03-13 DIAGNOSIS — Z23 Encounter for immunization: Secondary | ICD-10-CM

## 2020-03-13 MED ORDER — NAPROXEN 500 MG PO TABS: 500.0000 mg | ORAL_TABLET | Freq: Two times a day (BID) | ORAL | 1 refills | Status: DC | PRN

## 2020-03-13 NOTE — Progress Notes (Signed)
Patient ID: Misty Singh, female    DOB: 28-May-1971  MRN: [id number]  CC: Shoulder Pain   Subjective: Misty Singh is a 49 y.o. female who presents for chronic ds management.  Daughter is with her and interprets Her concerns today include:  Vit D def, IDA, fatigue, preDM  Pt c/o pain in both shoulders LT>RT for several mths. Started in April 2021.  No initiating factors. Hurts with elevation, forward and backward movement.  Feels better during the day when she is doing house work.  Pain worse at nights.  Seen at [facility] 10/2019 and given Medrol pack.  It helped dec pain during the day but pain returned after med finished.  Not taking anything for pain. Daughter wants to know if she can have refills on Medrol pack  Vit D def:  Took Vit D from OTC daily  for about 2 mths after last visit.  Daughter forgot to purchase more for her.    PreDM: A1C last yr was 5.7.  Does well with eating habits.  She exercises regularly.  She mows her own lawn with a push mower.  She is very active during the day doing housework.  HM:  Due for flu shot and colon cancer screening. No fhx of colon CA.     Patient Active Problem List   Diagnosis Date Noted   History of iron deficiency anemia 01/05/2017   History of vitamin D deficiency 01/05/2017     Current Outpatient Medications on File Prior to Visit  Medication Sig Dispense Refill   methylPREDNISolone (MEDROL DOSEPAK) 4 MG TBPK tablet tad (Patient not taking: Reported on 03/13/2020) 21 tablet 0   [DISCONTINUED] ferrous sulfate 325 (65 FE) MG tablet Take 1 tablet (325 mg total) by mouth 2 (two) times daily with a meal. (Patient not taking: Reported on 02/19/2018) 60 tablet 2   No current facility-administered medications on file prior to visit.    No Known Allergies  Social History   Socioeconomic History   Marital status: Married    Spouse name: Not on file   Number of children: Not on file   Years of education: Not on file   Highest education  level: Not on file  Occupational History   Not on file  Tobacco Use   Smoking status: Never Smoker   Smokeless tobacco: Never Used  Vaping Use   Vaping Use: Never used  Substance and Sexual Activity   Alcohol use: No   Drug use: No   Sexual activity: Not Currently  Other Topics Concern   Not on file  Social History Narrative   Not on file   Social Determinants of Health   Financial Resource Strain:    Difficulty of Paying Living Expenses: Not on file  Food Insecurity:    Worried About Running Out of Food in the Last Year: Not on file   Ran Out of Food in the Last Year: Not on file  Transportation Needs:    Lack of Transportation (Medical): Not on file   Lack of Transportation (Non-Medical): Not on file  Physical Activity:    Days of Exercise per Week: Not on file   Minutes of Exercise per Session: Not on file  Stress:    Feeling of Stress : Not on file  Social Connections:    Frequency of Communication with Friends and Family: Not on file   Frequency of Social Gatherings with Friends and Family: Not on file   Attends Religious Services: Not on  file   Active Member of Clubs or Organizations: Not on file   Attends Banker Meetings: Not on file   Marital Status: Not on file  Intimate Partner Violence:    Fear of Current or Ex-Partner: Not on file   Emotionally Abused: Not on file   Physically Abused: Not on file   Sexually Abused: Not on file    Family History  Family history unknown: Yes    Past Surgical History:  Procedure Laterality Date   TUBAL LIGATION      ROS: Review of Systems Negative except as stated above  PHYSICAL EXAM: BP 122/80    Pulse 77    Temp 98.3 F (36.8 C)    Resp 16    Wt 117 lb 12.8 oz (53.4 kg)    LMP 04/08/2019    SpO2 100%    BMI 22.26 kg/m   Physical Exam  General appearance - alert, well appearing, and in no distress Mental status - normal mood, behavior, speech, dress, motor activity,  and thought processes Chest - clear to auscultation, no wheezes, rales or rhonchi, symmetric air entry Heart - normal rate, regular rhythm, normal S1, S2, no murmurs, rubs, clicks or gallops Musculoskeletal -right shoulder: Mild tenderness on palpation of the anterior joint.  Mild discomfort with elevation above 90 degrees and with back with movement.  Drop arm test negative. Left shoulder: Mild point tenderness on palpation of the anterior joint.  She exhibits mild discomfort with passive range of motion in all directions.  Drop arm test negative.   CMP Latest Ref Rng & Units 04/08/2019 09/22/2016  Glucose 65 - 99 mg/dL 95 99  BUN 6 - 24 mg/dL 8 9  Creatinine 1.44 - 1.00 mg/dL 3.15 4.00(Q)  Sodium 676 - 144 mmol/L 139 139  Potassium 3.5 - 5.2 mmol/L 4.8 4.4  Chloride 96 - 106 mmol/L 102 98  CO2 20 - 29 mmol/L 23 23  Calcium 8.7 - 10.2 mg/dL 9.6 9.4  Total Protein 6.0 - 8.5 g/dL 8.2 7.9  Total Bilirubin 0.0 - 1.2 mg/dL 0.3 0.5  Alkaline Phos 39 - 117 IU/L 79 62  AST 0 - 40 IU/L 18 19  ALT 0 - 32 IU/L 12 17   Lipid Panel     Component Value Date/Time   CHOL 160 04/08/2019 1647   TRIG 119 04/08/2019 1647   HDL 50 04/08/2019 1647   CHOLHDL 3.2 04/08/2019 1647   LDLCALC 89 04/08/2019 1647    CBC    Component Value Date/Time   WBC 9.0 04/08/2019 1647   RBC 5.01 04/08/2019 1647   HGB 11.3 04/08/2019 1647   HCT 36.3 04/08/2019 1647   PLT 389 04/08/2019 1647   MCV 73 (L) 04/08/2019 1647   MCH 22.6 (L) 04/08/2019 1647   MCHC 31.1 (L) 04/08/2019 1647   RDW 14.5 04/08/2019 1647   LYMPHSABS 2.4 02/19/2018 1548   EOSABS 1.5 (H) 02/19/2018 1548   BASOSABS 0.1 02/19/2018 1548    ASSESSMENT AND PLAN: 1. Chronic pain of both shoulders Since pain has been going on for several months, I recommend that we get x-rays to check and see whether she has some arthritis versus bursitis.  Given that she has history of prediabetes, I think putting her on prednisone is not a good idea.  I  recommend taking Naprosyn as needed.  Advised to take with food. - naproxen (NAPROSYN) 500 MG tablet; Take 1 tablet (500 mg total) by mouth 2 (two) times daily  as needed for moderate pain (take with food).  Dispense: 40 tablet; Refill: 1 - DG Shoulder Left; Future - DG Shoulder Right; Future  2. Vitamin D deficiency Her daughter will purchase vitamin D 400 IU over-the-counter and she will take 1 tablet daily.  3. Prediabetes Patient declined having blood test today to check her A1c.  I have encouraged her to continue healthy eating habits and regular exercise.  4. Need for influenza vaccination Given today.  5. Screening for colon cancer We discussed colon cancer screening methods.  Patient prefers to have fit test done. - Fecal occult blood, imunochemical([facility])     Patient was given the opportunity to ask questions.  Patient verbalized understanding of the plan and was able to repeat key elements of the plan.   No orders of the defined types were placed in this encounter.    Requested Prescriptions    No prescriptions requested or ordered in this encounter    No follow-ups on file.  Jonah Blue, MD, FACP

## 2020-03-13 NOTE — Progress Notes (Signed)
Patient presents for vaccination against influenza per orders of Dr. Johnson. Consent given. Counseling provided. No contraindications exists. Vaccine administered without incident.  ° °Luke Van Ausdall, PharmD, CPP °Clinical Pharmacist °[facility] °[phone] ° °

## 2020-03-14 DIAGNOSIS — Z1211 Encounter for screening for malignant neoplasm of colon: Secondary | ICD-10-CM | POA: Diagnosis not present

## 2020-03-17 LAB — FECAL OCCULT BLOOD, IMMUNOCHEMICAL: Fecal Occult Bld: NEGATIVE

## 2020-10-28 DIAGNOSIS — Z419 Encounter for procedure for purposes other than remedying health state, unspecified: Secondary | ICD-10-CM | POA: Diagnosis not present

## 2020-10-30 DIAGNOSIS — Z23 Encounter for immunization: Secondary | ICD-10-CM | POA: Diagnosis not present

## 2020-11-28 DIAGNOSIS — Z419 Encounter for procedure for purposes other than remedying health state, unspecified: Secondary | ICD-10-CM | POA: Diagnosis not present

## 2020-12-28 DIAGNOSIS — Z419 Encounter for procedure for purposes other than remedying health state, unspecified: Secondary | ICD-10-CM | POA: Diagnosis not present

## 2021-01-28 DIAGNOSIS — Z419 Encounter for procedure for purposes other than remedying health state, unspecified: Secondary | ICD-10-CM | POA: Diagnosis not present

## 2021-02-28 DIAGNOSIS — Z419 Encounter for procedure for purposes other than remedying health state, unspecified: Secondary | ICD-10-CM | POA: Diagnosis not present

## 2021-03-30 DIAGNOSIS — Z419 Encounter for procedure for purposes other than remedying health state, unspecified: Secondary | ICD-10-CM | POA: Diagnosis not present

## 2021-04-30 DIAGNOSIS — Z419 Encounter for procedure for purposes other than remedying health state, unspecified: Secondary | ICD-10-CM | POA: Diagnosis not present

## 2021-05-30 DIAGNOSIS — Z419 Encounter for procedure for purposes other than remedying health state, unspecified: Secondary | ICD-10-CM | POA: Diagnosis not present

## 2021-06-14 ENCOUNTER — Ambulatory Visit: Payer: Medicaid Other | Admitting: Internal Medicine

## 2021-06-30 DIAGNOSIS — Z419 Encounter for procedure for purposes other than remedying health state, unspecified: Secondary | ICD-10-CM | POA: Diagnosis not present

## 2021-07-29 ENCOUNTER — Encounter: Payer: Self-pay | Admitting: Internal Medicine

## 2021-07-29 ENCOUNTER — Other Ambulatory Visit: Payer: Self-pay

## 2021-07-29 ENCOUNTER — Ambulatory Visit: Payer: Medicaid Other | Attending: Internal Medicine | Admitting: Internal Medicine

## 2021-07-29 VITALS — BP 121/81 | HR 75 | Resp 16 | Ht 61.0 in | Wt 125.6 lb

## 2021-07-29 DIAGNOSIS — Z0000 Encounter for general adult medical examination without abnormal findings: Secondary | ICD-10-CM

## 2021-07-29 DIAGNOSIS — Z23 Encounter for immunization: Secondary | ICD-10-CM | POA: Diagnosis not present

## 2021-07-29 DIAGNOSIS — Z1211 Encounter for screening for malignant neoplasm of colon: Secondary | ICD-10-CM

## 2021-07-29 DIAGNOSIS — Z5320 Procedure and treatment not carried out because of patient's decision for unspecified reasons: Secondary | ICD-10-CM | POA: Insufficient documentation

## 2021-07-29 NOTE — Progress Notes (Signed)
Patient ID: Misty Singh, female    DOB: 06/10/1971  MRN: [id number]  CC:    Subjective: Misty Singh is a 51 y.o. female who presents for routine check.  Daughter Eual Fines, is with her and interprets. Her concerns today include:   Pt for physical Not on any meds  HM: Due for flu shot, shingrix, MMG and colon cancer screen.  Declines HIV/hep C.  Declines MMG and defers on shingrix.  She will be due for Pap smear in April of this year which will be 5 years.  I offered to do the Pap smear today while she is here but patient declines.   Patient Active Problem List   Diagnosis Date Noted   Mammogram declined 07/29/2021   Chronic pain of both shoulders 03/13/2020   History of iron deficiency anemia 01/05/2017   History of vitamin D deficiency 01/05/2017     Current Outpatient Medications on File Prior to Visit  Medication Sig Dispense Refill   methylPREDNISolone (MEDROL DOSEPAK) 4 MG TBPK tablet tad (Patient not taking: Reported on 03/13/2020) 21 tablet 0   naproxen (NAPROSYN) 500 MG tablet Take 1 tablet (500 mg total) by mouth 2 (two) times daily as needed for moderate pain (take with food). (Patient not taking: Reported on 07/29/2021) 40 tablet 1   [DISCONTINUED] ferrous sulfate 325 (65 FE) MG tablet Take 1 tablet (325 mg total) by mouth 2 (two) times daily with a meal. (Patient not taking: Reported on 02/19/2018) 60 tablet 2   No current facility-administered medications on file prior to visit.    No Known Allergies  Social History   Socioeconomic History   Marital status: Married    Spouse name: Not on file   Number of children: Not on file   Years of education: Not on file   Highest education level: Not on file  Occupational History   Not on file  Tobacco Use   Smoking status: Never   Smokeless tobacco: Never  Vaping Use   Vaping Use: Never used  Substance and Sexual Activity   Alcohol use: No   Drug use: No   Sexual activity: Not Currently  Other Topics Concern   Not on  file  Social History Narrative   Not on file   Social Determinants of Health   Financial Resource Strain: Not on file  Food Insecurity: Not on file  Transportation Needs: Not on file  Physical Activity: Not on file  Stress: Not on file  Social Connections: Not on file  Intimate Partner Violence: Not on file    Family History  Family history unknown: Yes    Past Surgical History:  Procedure Laterality Date   TUBAL LIGATION      ROS: Review of Systems  Constitutional:        No exercise outside of house work. Eats fairly heathy.  Drinks mainly water, eats a lot of fruits and veggies.  Does not eat meat every day and when she does, no pork or red meat  HENT:  Negative for hearing loss and sore throat.        Had routine dental exam earlier this mth.   Eyes:  Negative for visual disturbance.       She has never had a formal eye exam.  Respiratory:  Negative for cough and shortness of breath.   Cardiovascular:  Negative for chest pain and palpitations.  Gastrointestinal:  Negative for blood in stool, constipation and diarrhea.  Genitourinary:  Negative for difficulty  urinating and dysuria.       Still gets menses every mth that last 3 days.  Regular bleeding.  Neurological:  Negative for dizziness and light-headedness.  Psychiatric/Behavioral:  Negative for dysphoric mood.   Negative except as stated above  PHYSICAL EXAM: BP 121/81    Pulse 75    Resp 16    Ht 5\' 1"  (1.549 m)    Wt 125 lb 9.6 oz (57 kg)    LMP 04/08/2019    SpO2 99%    BMI 23.73 kg/m   Wt Readings from Last 3 Encounters:  07/29/21 125 lb 9.6 oz (57 kg)  03/13/20 117 lb 12.8 oz (53.4 kg)  04/08/19 127 lb (57.6 kg)    Physical Exam Patient declines getting undressed to do the physical exam. General appearance - alert, well appearing, and in no distress Mental status - normal mood, behavior, speech, dress, motor activity, and thought processes Eyes - pupils equal and reactive, extraocular eye movements  intact Ears - bilateral TM's and external ear canals normal Nose - normal and patent, no erythema, discharge or polyps Mouth - mucous membranes moist, pharynx normal without lesions Neck - supple, no significant adenopathy Lymphatics - no palpable lymphadenopathy, no hepatosplenomegaly Chest - clear to auscultation, no wheezes, rales or rhonchi, symmetric air entry Heart - normal rate, regular rhythm, normal S1, S2, no murmurs, rubs, clicks or gallops Abdomen - soft, nontender, nondistended, no masses or organomegaly Breasts -patient declines breast exam. Pelvic -patient declines pelvic exam or having her Pap smear done today. Neurological - cranial nerves II through XII intact, motor and sensory grossly normal bilaterally Musculoskeletal - no joint tenderness, deformity or swelling Extremities - peripheral pulses normal, no pedal edema, no clubbing or cyanosis  CMP Latest Ref Rng & Units 04/08/2019 09/22/2016  Glucose 65 - 99 mg/dL 95 99  BUN 6 - 24 mg/dL 8 9  Creatinine 0.57 - 1.00 mg/dL 0.61 0.54(L)  Sodium 134 - 144 mmol/L 139 139  Potassium 3.5 - 5.2 mmol/L 4.8 4.4  Chloride 96 - 106 mmol/L 102 98  CO2 20 - 29 mmol/L 23 23  Calcium 8.7 - 10.2 mg/dL 9.6 9.4  Total Protein 6.0 - 8.5 g/dL 8.2 7.9  Total Bilirubin 0.0 - 1.2 mg/dL 0.3 0.5  Alkaline Phos 39 - 117 IU/L 79 62  AST 0 - 40 IU/L 18 19  ALT 0 - 32 IU/L 12 17   Lipid Panel     Component Value Date/Time   CHOL 160 04/08/2019 1647   TRIG 119 04/08/2019 1647   HDL 50 04/08/2019 1647   CHOLHDL 3.2 04/08/2019 1647   LDLCALC 89 04/08/2019 1647    CBC    Component Value Date/Time   WBC 9.0 04/08/2019 1647   RBC 5.01 04/08/2019 1647   HGB 11.3 04/08/2019 1647   HCT 36.3 04/08/2019 1647   PLT 389 04/08/2019 1647   MCV 73 (L) 04/08/2019 1647   MCH 22.6 (L) 04/08/2019 1647   MCHC 31.1 (L) 04/08/2019 1647   RDW 14.5 04/08/2019 1647   LYMPHSABS 2.4 02/19/2018 1548   EOSABS 1.5 (H) 02/19/2018 1548   BASOSABS 0.1  02/19/2018 1548    ASSESSMENT AND PLAN: 1. Annual physical exam Commended her on healthy eating habits.  Further counseling given.  Encouraged her to get in some form of moderate intensity exercise like brisk walking, riding a bike or swimming at least 3 to 5 days a week for 30 minutes. -Encouraged her to get routine eye  exams at least once every 2 years. -We will plan to do her Pap smear this spring. Discussed the importance of breast cancer screening with mammogram.  Patient still declines mammogram. - CBC - Comprehensive metabolic panel - Lipid panel  2. Need for immunization against influenza - Flu Vaccine QUAD 31mo+IM (Fluarix, Fluzone & Alfiuria Quad PF)  3. Screening for colon cancer - Fecal occult blood, imunochemical  4. HIV screening declined Patient does not feel she is at increased risk.  5. Screening for hepatitis C declined Patient does not feel she is at increased risk.  6. Mammogram declined   Patient was given the opportunity to ask questions.  Patient verbalized understanding of the plan and was able to repeat key elements of the plan.   Orders Placed This Encounter  Procedures   Fecal occult blood, imunochemical   Flu Vaccine QUAD 63mo+IM (Fluarix, Fluzone & Alfiuria Quad PF)   CBC   Comprehensive metabolic panel   Lipid panel     Requested Prescriptions    No prescriptions requested or ordered in this encounter    No follow-ups on file.  Karle Plumber, MD, FACP

## 2021-07-29 NOTE — Patient Instructions (Signed)

## 2021-07-30 LAB — COMPREHENSIVE METABOLIC PANEL
ALT: 24 IU/L (ref 0–32)
AST: 25 IU/L (ref 0–40)
Albumin/Globulin Ratio: 1.4 (ref 1.2–2.2)
Albumin: 4.6 g/dL (ref 3.8–4.9)
Alkaline Phosphatase: 87 IU/L (ref 44–121)
BUN/Creatinine Ratio: 18 (ref 9–23)
BUN: 11 mg/dL (ref 6–24)
Bilirubin Total: 0.7 mg/dL (ref 0.0–1.2)
CO2: 23 mmol/L (ref 20–29)
Calcium: 9.5 mg/dL (ref 8.7–10.2)
Chloride: 106 mmol/L (ref 96–106)
Creatinine, Ser: 0.62 mg/dL (ref 0.57–1.00)
Globulin, Total: 3.3 g/dL (ref 1.5–4.5)
Glucose: 100 mg/dL — ABNORMAL HIGH (ref 70–99)
Potassium: 4.6 mmol/L (ref 3.5–5.2)
Sodium: 145 mmol/L — ABNORMAL HIGH (ref 134–144)
Total Protein: 7.9 g/dL (ref 6.0–8.5)
eGFR: 107 mL/min/{1.73_m2} (ref >59)

## 2021-07-30 LAB — LIPID PANEL
Chol/HDL Ratio: 3.3 ratio (ref 0.0–4.4)
Cholesterol, Total: 195 mg/dL (ref 100–199)
HDL: 60 mg/dL (ref >39)
LDL Chol Calc (NIH): 122 mg/dL — ABNORMAL HIGH (ref 0–99)
Triglycerides: 71 mg/dL (ref 0–149)
VLDL Cholesterol Cal: 13 mg/dL (ref 5–40)

## 2021-07-30 LAB — CBC
Hematocrit: 38.2 % (ref 34.0–46.6)
Hemoglobin: 12 g/dL (ref 11.1–15.9)
MCH: 22.1 pg — ABNORMAL LOW (ref 26.6–33.0)
MCHC: 31.4 g/dL — ABNORMAL LOW (ref 31.5–35.7)
MCV: 71 fL — ABNORMAL LOW (ref 79–97)
Platelets: 349 10*3/uL (ref 150–450)
RBC: 5.42 x10E6/uL — ABNORMAL HIGH (ref 3.77–5.28)
RDW: 12.9 % (ref 11.7–15.4)
WBC: 7.6 10*3/uL (ref 3.4–10.8)

## 2021-07-31 DIAGNOSIS — Z419 Encounter for procedure for purposes other than remedying health state, unspecified: Secondary | ICD-10-CM | POA: Diagnosis not present

## 2021-08-06 ENCOUNTER — Telehealth: Payer: Self-pay

## 2021-08-06 NOTE — Telephone Encounter (Signed)
Contacted pt/ pt daughter to go over lab results no answer and was unable to lvm   Sent a CRM and forward labs to NT to give pt labs when they call back

## 2021-08-28 DIAGNOSIS — Z419 Encounter for procedure for purposes other than remedying health state, unspecified: Secondary | ICD-10-CM | POA: Diagnosis not present

## 2021-09-16 DIAGNOSIS — Z1211 Encounter for screening for malignant neoplasm of colon: Secondary | ICD-10-CM | POA: Diagnosis not present

## 2021-09-18 LAB — FECAL OCCULT BLOOD, IMMUNOCHEMICAL: Fecal Occult Bld: NEGATIVE

## 2021-09-28 DIAGNOSIS — Z419 Encounter for procedure for purposes other than remedying health state, unspecified: Secondary | ICD-10-CM | POA: Diagnosis not present

## 2021-09-30 ENCOUNTER — Ambulatory Visit: Payer: Medicaid Other | Admitting: Internal Medicine

## 2021-10-08 ENCOUNTER — Ambulatory Visit: Payer: Self-pay | Admitting: Family Medicine

## 2021-10-28 DIAGNOSIS — Z419 Encounter for procedure for purposes other than remedying health state, unspecified: Secondary | ICD-10-CM | POA: Diagnosis not present

## 2021-11-28 DIAGNOSIS — Z419 Encounter for procedure for purposes other than remedying health state, unspecified: Secondary | ICD-10-CM | POA: Diagnosis not present

## 2021-12-28 DIAGNOSIS — Z419 Encounter for procedure for purposes other than remedying health state, unspecified: Secondary | ICD-10-CM | POA: Diagnosis not present

## 2022-01-28 DIAGNOSIS — Z419 Encounter for procedure for purposes other than remedying health state, unspecified: Secondary | ICD-10-CM | POA: Diagnosis not present

## 2022-02-24 ENCOUNTER — Ambulatory Visit: Payer: Medicaid Other | Attending: Internal Medicine | Admitting: Internal Medicine

## 2022-02-24 ENCOUNTER — Other Ambulatory Visit (HOSPITAL_COMMUNITY)
Admission: RE | Admit: 2022-02-24 | Discharge: 2022-02-24 | Disposition: A | Discharge status: Home / Self Care | Payer: Medicaid Other | Source: Ambulatory Visit | Attending: Internal Medicine | Admitting: Internal Medicine

## 2022-02-24 ENCOUNTER — Encounter: Payer: Self-pay | Admitting: Internal Medicine

## 2022-02-24 VITALS — BP 118/78 | HR 70 | Temp 98.2°F | Ht 61.0 in | Wt 118.2 lb

## 2022-02-24 DIAGNOSIS — Z124 Encounter for screening for malignant neoplasm of cervix: Secondary | ICD-10-CM | POA: Insufficient documentation

## 2022-02-24 DIAGNOSIS — Z5320 Procedure and treatment not carried out because of patient's decision for unspecified reasons: Secondary | ICD-10-CM | POA: Diagnosis not present

## 2022-02-24 NOTE — Progress Notes (Signed)
Patient ID: Misty Singh, female    DOB: 1971-06-14  MRN: [id number]  CC: Gynecologic Exam   Subjective: Misty Singh is a 51 y.o. female who presents for pap.  Daughter is here and interprets Her concerns today include:    GYN History:  Pt is G6P6 Any hx of abn paps?: no Menses regular or irregular?:  regular menses still How long does menses last?  2-3 days Menstrual flow light or heavy?:  light Method of birth control?:  tubal ligation Any vaginal dischg at this time?: no Dysuria?: no Any hx of STI?:  Sexually active with how many partners: 1 her spouse Desires STI screen: no Last MMG: 2021.  DECLINES MMG Family hx of uterine, cervical or breast cancer?:  no  She declines MMG.  Defers on flu shot until October.  Declines Shingles vaccine. Patient Active Problem List   Diagnosis Date Noted   Mammogram declined 07/29/2021   Chronic pain of both shoulders 03/13/2020   History of iron deficiency anemia 01/05/2017   History of vitamin D deficiency 01/05/2017     Current Outpatient Medications on File Prior to Visit  Medication Sig Dispense Refill   methylPREDNISolone (MEDROL DOSEPAK) 4 MG TBPK tablet tad 21 tablet 0   naproxen (NAPROSYN) 500 MG tablet Take 1 tablet (500 mg total) by mouth 2 (two) times daily as needed for moderate pain (take with food). 40 tablet 1   [DISCONTINUED] ferrous sulfate 325 (65 FE) MG tablet Take 1 tablet (325 mg total) by mouth 2 (two) times daily with a meal. (Patient not taking: Reported on 02/19/2018) 60 tablet 2   No current facility-administered medications on file prior to visit.    No Known Allergies  Social History   Socioeconomic History   Marital status: Married    Spouse name: Not on file   Number of children: Not on file   Years of education: Not on file   Highest education level: Not on file  Occupational History   Not on file  Tobacco Use   Smoking status: Never   Smokeless tobacco: Never  Vaping Use   Vaping Use: Never  used  Substance and Sexual Activity   Alcohol use: No   Drug use: No   Sexual activity: Not Currently  Other Topics Concern   Not on file  Social History Narrative   Not on file   Social Determinants of Health   Financial Resource Strain: Not on file  Food Insecurity: Not on file  Transportation Needs: Not on file  Physical Activity: Not on file  Stress: Not on file  Social Connections: Not on file  Intimate Partner Violence: Not on file    Family History  Family history unknown: Yes    Past Surgical History:  Procedure Laterality Date   TUBAL LIGATION      ROS: Review of Systems Negative except as stated above  PHYSICAL EXAM: BP 118/78   Pulse 70   Temp 98.2 F (36.8 C) (Oral)   Ht 5\' 1"  (1.549 m)   Wt 118 lb 3.2 oz (53.6 kg)   LMP 04/08/2019   SpO2 99%   BMI 22.33 kg/m   Physical Exam  General appearance - alert, well appearing, and in no distress Mental status - normal mood, behavior, speech, dress, motor activity, and thought processes Pelvic - CMA DeEtta 06/08/2019 present:  normal external genitalia, vulva, vagina, cervix, uterus and adnexa.  Polyp in cervical oz      Latest Ref  Rng & Units 07/29/2021   11:06 AM 04/08/2019    4:47 PM 09/22/2016    9:15 AM  CMP  Glucose 70 - 99 mg/dL 338  95  99   BUN 6 - 24 mg/dL 11  8  9    Creatinine 0.57 - 1.00 mg/dL  2.50  5.39   Sodium 134 - 144 mmol/L 145  139  139   Potassium 3.5 - 5.2 mmol/L 4.6  4.8  4.4   Chloride 96 - 106 mmol/L 106  102  98   CO2 20 - 29 mmol/L 23  23  23    Calcium 8.7 - 10.2 mg/dL 9.5  9.6  9.4   Total Protein 6.0 - 8.5 g/dL 7.9  8.2  7.9   Total Bilirubin 0.0 - 1.2 mg/dL 0.7  0.3  0.5   Alkaline Phos 44 - 121 IU/L 87  79  62   AST 0 - 40 IU/L 25  18  19    ALT 0 - 32 IU/L 24  12  17     Lipid Panel     Component Value Date/Time   CHOL 195 07/29/2021 1106   TRIG 71 07/29/2021 1106   HDL 60 07/29/2021 1106   CHOLHDL 3.3 07/29/2021 1106   LDLCALC 122 (H) 07/29/2021 1106     CBC    Component Value Date/Time   WBC 7.6 07/29/2021 1106   RBC 5.42 (H) 07/29/2021 1106   HGB 12.0 07/29/2021 1106   HCT 38.2 07/29/2021 1106   PLT 349 07/29/2021 1106   MCV 71 (L) 07/29/2021 1106   MCH 22.1 (L) 07/29/2021 1106   MCHC 31.4 (L) 07/29/2021 1106   RDW 12.9 07/29/2021 1106   LYMPHSABS 2.4 02/19/2018 1548   EOSABS 1.5 (H) 02/19/2018 1548   BASOSABS 0.1 02/19/2018 1548    ASSESSMENT AND PLAN:  1. Pap smear for cervical cancer screening - Cytology - PAP  2. Mammogram declined Recommended.  Patient declined.    Patient was given the opportunity to ask questions.  Patient verbalized understanding of the plan and was able to repeat key elements of the plan.   This documentation was completed using 07/31/2021.  Any transcriptional errors are unintentional.  No orders of the defined types were placed in this encounter.    Requested Prescriptions    No prescriptions requested or ordered in this encounter    No follow-ups on file.  02/21/2018, MD, FACP

## 2022-02-27 LAB — CYTOLOGY - PAP
Comment: NEGATIVE
Diagnosis: NEGATIVE
High risk HPV: NEGATIVE

## 2022-02-28 DIAGNOSIS — Z419 Encounter for procedure for purposes other than remedying health state, unspecified: Secondary | ICD-10-CM | POA: Diagnosis not present

## 2022-03-30 DIAGNOSIS — Z419 Encounter for procedure for purposes other than remedying health state, unspecified: Secondary | ICD-10-CM | POA: Diagnosis not present

## 2022-04-30 DIAGNOSIS — Z419 Encounter for procedure for purposes other than remedying health state, unspecified: Secondary | ICD-10-CM | POA: Diagnosis not present

## 2022-05-30 DIAGNOSIS — Z419 Encounter for procedure for purposes other than remedying health state, unspecified: Secondary | ICD-10-CM | POA: Diagnosis not present

## 2022-08-04 DIAGNOSIS — Z205 Contact with and (suspected) exposure to viral hepatitis: Secondary | ICD-10-CM | POA: Diagnosis not present

## 2022-10-29 DIAGNOSIS — Z419 Encounter for procedure for purposes other than remedying health state, unspecified: Secondary | ICD-10-CM | POA: Diagnosis not present

## 2022-11-29 DIAGNOSIS — Z419 Encounter for procedure for purposes other than remedying health state, unspecified: Secondary | ICD-10-CM | POA: Diagnosis not present

## 2022-12-29 DIAGNOSIS — Z419 Encounter for procedure for purposes other than remedying health state, unspecified: Secondary | ICD-10-CM | POA: Diagnosis not present

## 2023-01-29 DIAGNOSIS — Z419 Encounter for procedure for purposes other than remedying health state, unspecified: Secondary | ICD-10-CM | POA: Diagnosis not present

## 2023-03-01 DIAGNOSIS — Z419 Encounter for procedure for purposes other than remedying health state, unspecified: Secondary | ICD-10-CM | POA: Diagnosis not present

## 2023-03-31 DIAGNOSIS — Z419 Encounter for procedure for purposes other than remedying health state, unspecified: Secondary | ICD-10-CM | POA: Diagnosis not present

## 2023-05-31 DIAGNOSIS — Z419 Encounter for procedure for purposes other than remedying health state, unspecified: Secondary | ICD-10-CM | POA: Diagnosis not present

## 2023-06-22 ENCOUNTER — Encounter (HOSPITAL_COMMUNITY): Payer: Self-pay

## 2023-06-22 ENCOUNTER — Ambulatory Visit (HOSPITAL_COMMUNITY)
Admission: EM | Admit: 2023-06-22 | Discharge: 2023-06-22 | Disposition: A | Discharge status: Home / Self Care | Payer: Medicaid Other | Attending: Emergency Medicine | Admitting: Emergency Medicine

## 2023-06-22 DIAGNOSIS — K5900 Constipation, unspecified: Secondary | ICD-10-CM | POA: Diagnosis not present

## 2023-06-22 MED ORDER — POLYETHYLENE GLYCOL 4500 POWD
17.0000 g | Freq: Every day | 0 refills | Status: DC
Start: 2023-06-22 — End: 2023-11-09

## 2023-06-22 NOTE — Discharge Instructions (Addendum)
Miralax (polyethylene glycol) cleanse out: 1 dose dissolved in a full glass of water, drink it down Do twice daily for 3 days in a row (morning and night) Then use once daily for 3 more days, and continue as needed  If NO bowel movements at all after 3 days, abdominal pain worsens, or vomiting starts, please go to the emergency department.  Call primary care provider for follow up appointment

## 2023-06-22 NOTE — ED Triage Notes (Signed)
Per daughter, pt hasn't had a BM in 8 days. C/o feeling hot all over c/o upper abdominal/back/rib discomfort since last night. States took miralax on Friday with no relief. States change from white rice to brown rice d/t prediabetic a month ago.

## 2023-06-22 NOTE — ED Provider Notes (Signed)
[facility]    CSN: [id number] Arrival date & time: 06/22/23  0945      History   Chief Complaint Chief Complaint  Patient presents with   Constipation    HPI Stefan ROXANNA VANDERWALKER is a 52 y.o. female.  Daughter translates per patient request Concerns for constipation Had no BM for 8 days until a small amount this morning. She used a single dose of miralax 2 days ago. No other meds. Having abdominal discomfort rated 6/10 intermittently No vomiting. No urinary symptoms Denies fever but body felt hot last night   Postmenopausal   Daughter says she eats rice. Not much fiber or vegetables. Has tried increasing water today  History reviewed. No pertinent past medical history.  Patient Active Problem List   Diagnosis Date Noted   Mammogram declined 07/29/2021   Chronic pain of both shoulders 03/13/2020   History of iron deficiency anemia 01/05/2017   History of vitamin D deficiency 01/05/2017    Past Surgical History:  Procedure Laterality Date   TUBAL LIGATION      OB History   No obstetric history on file.      Home Medications    Prior to Admission medications   Medication Sig Start Date End Date Taking? Authorizing Provider  Polyethylene Glycol 4500 POWD 17 g by Does not apply route daily at 6 (six) AM. Dissolve 1 capful of powder in one 8 ounce glass of water. Repeat daily or as needed to soften stool 06/22/23  Yes Kaili Castille, Lurena Joiner, PA-C  ferrous sulfate 325 (65 FE) MG tablet Take 1 tablet (325 mg total) by mouth 2 (two) times daily with a meal. Patient not taking: Reported on 02/19/2018 09/30/16 11/08/19  Lizbeth Bark, FNP    Family History Family History  Family history unknown: Yes    Social History Social History   Tobacco Use   Smoking status: Never   Smokeless tobacco: Never  Vaping Use   Vaping status: Never Used  Substance Use Topics   Alcohol use: No   Drug use: No     Allergies   Patient has no known allergies.   Review of  Systems Review of Systems  Gastrointestinal:  Positive for constipation.   Per HPI  Physical Exam Triage Vital Signs ED Triage Vitals [06/22/23 1004]  Encounter Vitals Group     BP 130/82     Systolic BP Percentile      Diastolic BP Percentile      Pulse Rate 89     Resp 18     Temp 98.4 F (36.9 C)     Temp Source Oral     SpO2 98 %     Weight      Height      Head Circumference      Peak Flow      Pain Score 6     Pain Loc      Pain Education      Exclude from Growth Chart    No data found.  Updated Vital Signs BP 130/82 (BP Location: Right Arm)   Pulse 89   Temp 98.4 F (36.9 C) (Oral)   Resp 18   LMP 04/08/2019   SpO2 98%   Physical Exam Vitals and nursing note reviewed.  Constitutional:      Appearance: Normal appearance.  HENT:     Mouth/Throat:     Mouth: Mucous membranes are moist.     Pharynx: Oropharynx is clear.  Eyes:  Conjunctiva/sclera: Conjunctivae normal.  Cardiovascular:     Rate and Rhythm: Normal rate and regular rhythm.     Pulses: Normal pulses.          Radial pulses are 2+ on the right side and 2+ on the left side.     Heart sounds: Normal heart sounds.  Pulmonary:     Effort: Pulmonary effort is normal.     Breath sounds: Normal breath sounds.  Abdominal:     General: Bowel sounds are normal. There is no distension.     Palpations: Abdomen is soft. There is no mass.     Tenderness: There is no abdominal tenderness. There is no right CVA tenderness, left CVA tenderness, guarding or rebound.  Musculoskeletal:        General: Normal range of motion.  Skin:    General: Skin is warm and dry.  Neurological:     Mental Status: She is alert and oriented to person, place, and time.     UC Treatments / Results  Labs (all labs ordered are listed, but only abnormal results are displayed) Labs Reviewed - No data to display  EKG  Radiology No results found.  Procedures Procedures   Medications Ordered in UC Medications -  No data to display  Initial Impression / Assessment and Plan / UC Course  I have reviewed the triage vital signs and the nursing notes.  Pertinent labs & imaging results that were available during my care of the patient were reviewed by me and considered in my medical decision making (see chart for details).  Afebrile, stable vitals, non tender on abd exam No red flags at this time Recommend miralax cleanse out, discussed 1 dose was not enough to relieve symptoms so need to increase. Advised twice daily for 3 days, then continuing once daily for a few days. Increasing fiber and water intake. Discussed strict return and ED precautions. Patient and family are advised if she still does not have a bowel movement after 3 days, abdominal pain becomes severe, or if she starts vomiting, needs to go to the emergency department.  Daughter was concerned about kidneys.  We discussed where the kidneys are located, she is not having urinary symptoms and no CVA tenderness on exam.  I suspect this is likely more intestinal, would recommend close follow up with PCP. Patient and family agree to plan  Final Clinical Impressions(s) / UC Diagnoses   Final diagnoses:  Constipation, unspecified constipation type     Discharge Instructions      Miralax (polyethylene glycol) cleanse out: 1 dose dissolved in a full glass of water, drink it down Do twice daily for 3 days in a row (morning and night) Then use once daily for 3 more days, and continue as needed  If NO bowel movements at all after 3 days, abdominal pain worsens, or vomiting starts, please go to the emergency department.  Call primary care provider for follow up appointment      ED Prescriptions     Medication Sig Dispense Auth. Provider   Polyethylene Glycol 4500 POWD 17 g by Does not apply route daily at 6 (six) AM. Dissolve 1 capful of powder in one 8 ounce glass of water. Repeat daily or as needed to soften stool 500 g Timber Lucarelli, Lurena Joiner,  PA-C      PDMP not reviewed this encounter.   Marlow Baars, [place] 06/22/23 1157

## 2023-07-01 DIAGNOSIS — Z419 Encounter for procedure for purposes other than remedying health state, unspecified: Secondary | ICD-10-CM | POA: Diagnosis not present

## 2023-08-01 DIAGNOSIS — Z419 Encounter for procedure for purposes other than remedying health state, unspecified: Secondary | ICD-10-CM | POA: Diagnosis not present

## 2023-08-29 DIAGNOSIS — Z419 Encounter for procedure for purposes other than remedying health state, unspecified: Secondary | ICD-10-CM | POA: Diagnosis not present

## 2023-09-14 ENCOUNTER — Encounter: Payer: Medicaid Other | Admitting: Internal Medicine

## 2023-10-10 DIAGNOSIS — Z419 Encounter for procedure for purposes other than remedying health state, unspecified: Secondary | ICD-10-CM | POA: Diagnosis not present

## 2023-11-09 ENCOUNTER — Other Ambulatory Visit: Payer: Self-pay

## 2023-11-09 ENCOUNTER — Encounter: Payer: Self-pay | Admitting: Internal Medicine

## 2023-11-09 ENCOUNTER — Ambulatory Visit: Attending: Internal Medicine | Admitting: Internal Medicine

## 2023-11-09 VITALS — BP 123/79 | HR 71 | Temp 98.3°F | Resp 16 | Ht 61.0 in | Wt 123.0 lb

## 2023-11-09 DIAGNOSIS — Z23 Encounter for immunization: Secondary | ICD-10-CM

## 2023-11-09 DIAGNOSIS — Z1159 Encounter for screening for other viral diseases: Secondary | ICD-10-CM | POA: Diagnosis not present

## 2023-11-09 DIAGNOSIS — Z5320 Procedure and treatment not carried out because of patient's decision for unspecified reasons: Secondary | ICD-10-CM

## 2023-11-09 DIAGNOSIS — R7303 Prediabetes: Secondary | ICD-10-CM | POA: Diagnosis not present

## 2023-11-09 DIAGNOSIS — M25512 Pain in left shoulder: Secondary | ICD-10-CM | POA: Diagnosis not present

## 2023-11-09 DIAGNOSIS — Z1231 Encounter for screening mammogram for malignant neoplasm of breast: Secondary | ICD-10-CM

## 2023-11-09 DIAGNOSIS — G5603 Carpal tunnel syndrome, bilateral upper limbs: Secondary | ICD-10-CM | POA: Diagnosis not present

## 2023-11-09 DIAGNOSIS — Z1211 Encounter for screening for malignant neoplasm of colon: Secondary | ICD-10-CM

## 2023-11-09 DIAGNOSIS — Z0000 Encounter for general adult medical examination without abnormal findings: Secondary | ICD-10-CM

## 2023-11-09 DIAGNOSIS — Z0100 Encounter for examination of eyes and vision without abnormal findings: Secondary | ICD-10-CM

## 2023-11-09 DIAGNOSIS — E785 Hyperlipidemia, unspecified: Secondary | ICD-10-CM

## 2023-11-09 DIAGNOSIS — Z419 Encounter for procedure for purposes other than remedying health state, unspecified: Secondary | ICD-10-CM | POA: Diagnosis not present

## 2023-11-09 MED ORDER — NAPROXEN 500 MG PO TABS
500.0000 mg | ORAL_TABLET | Freq: Two times a day (BID) | ORAL | 1 refills | Status: AC | PRN
  Filled 2023-11-09: qty 40, 20d supply, fill #0

## 2023-11-09 MED ORDER — VITAMIN D (CHOLECALCIFEROL) 10 MCG (400 UNIT) PO CAPS
10.0000 ug | ORAL_CAPSULE | Freq: Every day | ORAL | 1 refills | Status: AC
  Filled 2023-11-09: qty 100, fill #0

## 2023-11-09 NOTE — Progress Notes (Signed)
 Refill on naproxen  for shoulder pain Numbness in fingers at night

## 2023-11-09 NOTE — Patient Instructions (Signed)
 Preventive Care 16-54 Years Old, Female  Preventive care refers to lifestyle choices and visits with your health care provider that can promote health and wellness. Preventive care visits are also called wellness exams.  What can I expect for my preventive care visit?  Counseling  Your health care provider may ask you questions about your:  Medical history, including:  Past medical problems.  Family medical history.  Pregnancy history.  Current health, including:  Menstrual cycle.  Method of birth control.  Emotional well-being.  Home life and relationship well-being.  Sexual activity and sexual health.  Lifestyle, including:  Alcohol, nicotine or tobacco, and drug use.  Access to firearms.  Diet, exercise, and sleep habits.  Work and work Astronomer.  Sunscreen use.  Safety issues such as seatbelt and bike helmet use.  Physical exam  Your health care provider will check your:  Height and weight. These may be used to calculate your BMI (body mass index). BMI is a measurement that tells if you are at a healthy weight.  Waist circumference. This measures the distance around your waistline. This measurement also tells if you are at a healthy weight and may help predict your risk of certain diseases, such as type 2 diabetes and high blood pressure.  Heart rate and blood pressure.  Body temperature.  Skin for abnormal spots.  What immunizations do I need?    Vaccines are usually given at various ages, according to a schedule. Your health care provider will recommend vaccines for you based on your age, medical history, and lifestyle or other factors, such as travel or where you work.  What tests do I need?  Screening  Your health care provider may recommend screening tests for certain conditions. This may include:  Lipid and cholesterol levels.  Diabetes screening. This is done by checking your blood sugar (glucose) after you have not eaten for a while (fasting).  Pelvic exam and Pap test.  Hepatitis B test.  Hepatitis C  test.  HIV (human immunodeficiency virus) test.  STI (sexually transmitted infection) testing, if you are at risk.  Lung cancer screening.  Colorectal cancer screening.  Mammogram. Talk with your health care provider about when you should start having regular mammograms. This may depend on whether you have a family history of breast cancer.  BRCA-related cancer screening. This may be done if you have a family history of breast, ovarian, tubal, or peritoneal cancers.  Bone density scan. This is done to screen for osteoporosis.  Talk with your health care provider about your test results, treatment options, and if necessary, the need for more tests.  Follow these instructions at home:  Eating and drinking    Eat a diet that includes fresh fruits and vegetables, whole grains, lean protein, and low-fat dairy products.  Take vitamin and mineral supplements as recommended by your health care provider.  Do not drink alcohol if:  Your health care provider tells you not to drink.  You are pregnant, may be pregnant, or are planning to become pregnant.  If you drink alcohol:  Limit how much you have to 0-1 drink a day.  Know how much alcohol is in your drink. In the [place] one drink equals one 12 oz bottle of beer (355 mL), one 5 oz glass of wine (148 mL), or one 1 oz glass of hard liquor (44 mL).  Lifestyle  Brush your teeth every morning and night with fluoride toothpaste. Floss one time each day.  Exercise for at least  30 minutes 5 or more days each week.  Do not use any products that contain nicotine or tobacco. These products include cigarettes, chewing tobacco, and vaping devices, such as e-cigarettes. If you need help quitting, ask your health care provider.  Do not use drugs.  If you are sexually active, practice safe sex. Use a condom or other form of protection to prevent STIs.  If you do not wish to become pregnant, use a form of birth control. If you plan to become pregnant, see your health care provider for a  prepregnancy visit.  Take aspirin only as told by your health care provider. Make sure that you understand how much to take and what form to take. Work with your health care provider to find out whether it is safe and beneficial for you to take aspirin daily.  Find healthy ways to manage stress, such as:  Meditation, yoga, or listening to music.  Journaling.  Talking to a trusted person.  Spending time with friends and family.  Minimize exposure to UV radiation to reduce your risk of skin cancer.  Safety  Always wear your seat belt while driving or riding in a vehicle.  Do not drive:  If you have been drinking alcohol. Do not ride with someone who has been drinking.  When you are tired or distracted.  While texting.  If you have been using any mind-altering substances or drugs.  Wear a helmet and other protective equipment during sports activities.  If you have firearms in your house, make sure you follow all gun safety procedures.  Seek help if you have been physically or sexually abused.  What's next?  Visit your health care provider once a year for an annual wellness visit.  Ask your health care provider how often you should have your eyes and teeth checked.  Stay up to date on all vaccines.  This information is not intended to replace advice given to you by your health care provider. Make sure you discuss any questions you have with your health care provider.  Document Revised: 12/12/2020 Document Reviewed: 12/12/2020  Elsevier Patient Education  2024 [employer].

## 2023-11-09 NOTE — Progress Notes (Signed)
 Patient ID: Misty Singh, female    DOB: Nov 13, 1970  MRN: [id number]  CC: Annual Exam   Subjective: Misty Singh is a 53 y.o. female who presents for physical. Daughter, Misty Singh, is with her and pt has requested that she interprets.  [place]avier form signed today.  Her concerns today include:   HM: due for MMG, colon CA screen, hep C and HIV screen and shingles vaccine. Had negative hep B screening throught the [facility] 1.5 yrs ago after husband died of hep B.  C/o numbness in both hands intermittently that wakes her at nights x few mths Goes away with rubbing the hands. Does not regularly engage in repeated activities with the hands throughout the day.  Sews occasionally -no change in strength C/o pain LT shoulder with certain movements especially with elevation.  No initiating factors.  Reports similar episode a few years ago that responded well to Naprosyn .  Requests a prescription.   Previous history of prediabetes and hyperlipidemia.  She would like to be rechecked today. Patient Active Problem List   Diagnosis Date Noted   Mammogram declined 07/29/2021   Chronic pain of both shoulders 03/13/2020   History of iron deficiency anemia 01/05/2017   History of vitamin D  deficiency 01/05/2017     Current Outpatient Medications on File Prior to Visit  Medication Sig Dispense Refill   [DISCONTINUED] ferrous sulfate  325 (65 FE) MG tablet Take 1 tablet (325 mg total) by mouth 2 (two) times daily with a meal. (Patient not taking: Reported on 02/19/2018) 60 tablet 2   No current facility-administered medications on file prior to visit.    No Known Allergies  Social History   Socioeconomic History   Marital status: Married    Spouse name: Not on file   Number of children: Not on file   Years of education: Not on file   Highest education level: Not on file  Occupational History   Not on file  Tobacco Use   Smoking status: Never   Smokeless tobacco: Never  Vaping Use   Vaping status: Never Used   Substance and Sexual Activity   Alcohol use: No   Drug use: No   Sexual activity: Not Currently  Other Topics Concern   Not on file  Social History Narrative   Not on file   Social Drivers of Health   Financial Resource Strain: Low Risk  (11/09/2023)   Overall Financial Resource Strain (CARDIA)    Difficulty of Paying Living Expenses: Not very hard  Food Insecurity: No Food Insecurity (11/09/2023)   Hunger Vital Sign    Worried About Running Out of Food in the Last Year: Never true    Ran Out of Food in the Last Year: Never true  Transportation Needs: No Transportation Needs (11/09/2023)   PRAPARE - Administrator, Civil Service (Medical): No    Lack of Transportation (Non-Medical): No  Physical Activity: Insufficiently Active (11/09/2023)   Exercise Vital Sign    Days of Exercise per Week: 5 days    Minutes of Exercise per Session: 20 Jolana  Stress: No Stress Concern Present (11/09/2023)   [employer] of Occupational Health - Occupational Stress Questionnaire    Feeling of Stress : Not at all  Social Connections: Moderately Integrated (11/09/2023)   Social Connection and Isolation Panel [NHANES]    Frequency of Communication with Friends and Family: More than three times a week    Frequency of Social Gatherings with Friends and Family: More  than three times a week    Attends Religious Services: More than 4 times per year    Active Member of Clubs or Organizations: Yes    Attends Banker Meetings: More than 4 times per year    Marital Status: Widowed  Intimate Partner Violence: Not At Risk (11/09/2023)   Humiliation, Afraid, Rape, and Kick questionnaire    Fear of Current or Ex-Partner: No    Emotionally Abused: No    Physically Abused: No    Sexually Abused: No    Family History  Family history unknown: Yes    Past Surgical History:  Procedure Laterality Date   TUBAL LIGATION      ROS: Review of Systems  Constitutional:        Stays  busy working in her garden.  She also mows her own lawn.  Does well with eating habits.  She is vegetarian.  HENT:  Negative for congestion, hearing loss, sinus pressure and trouble swallowing.   Eyes:  Negative for visual disturbance.       She has not had any problems with her vision.  She has not had a routine eye exam in years.  Respiratory:  Negative for cough and shortness of breath.   Cardiovascular:  Negative for chest pain.  Gastrointestinal:  Negative for abdominal pain and blood in stool.  Endocrine:       She is postmenopausal.  Genitourinary:  Negative for difficulty urinating.  Musculoskeletal:  Negative for arthralgias.  Skin:        Has a spot on the right upper back which she states she has had from childhood.  It has not changed in size.   Negative except as stated above  PHYSICAL EXAM: BP 123/79   Pulse 71   Temp 98.3 F (36.8 C) (Oral)   Resp 16   Ht 5\' 1"  (1.549 m)   Wt 123 lb (55.8 kg)   LMP 04/08/2019   SpO2 100%   BMI 23.24 kg/m   Physical Exam  General appearance - alert, well appearing, and in no distress Mental status - normal mood, behavior, speech, dress, motor activity, and thought processes Eyes - pupils equal and reactive, extraocular eye movements intact Ears - bilateral TM's and external ear canals normal Nose - normal and patent, no erythema, discharge or polyps Mouth - mucous membranes moist, pharynx normal without lesions Neck - supple, no significant adenopathy Lymphatics - no palpable lymphadenopathy, no hepatosplenomegaly Chest - clear to auscultation, no wheezes, rales or rhonchi, symmetric air entry Heart - normal rate, regular rhythm, normal S1, S2, no murmurs, rubs, clicks or gallops Abdomen - soft, nontender, nondistended, no masses or organomegaly Breasts - breasts appear normal, no suspicious masses, no skin or nipple changes or axillary nodes Neurological - cranial nerves II through XII intact, motor and sensory grossly normal  bilaterally.  Tinel's sign negative bilaterally.  No wasting of intrinsic muscles of the hands. Musculoskeletal -mild enlargement of the PIP joints of the right hand. Extremities - peripheral pulses normal, no pedal edema, no clubbing or cyanosis Skin -0.5 cm clear slightly raised skin color lesion on the right upper posterior trunk.  Patient states she has had this since childhood and it has not changed.      Latest Ref Rng & Units 07/29/2021   11:06 AM 04/08/2019    4:47 PM 09/22/2016    9:15 AM  CMP  Glucose 70 - 99 mg/dL 409  95  99   BUN 6 -  24 mg/dL 11  8  9    Creatinine 0.57 - 1.00 mg/dL 1.61  0.96  0.45   Sodium 134 - 144 mmol/L 145  139  139   Potassium 3.5 - 5.2 mmol/L 4.6  4.8  4.4   Chloride 96 - 106 mmol/L 106  102  98   CO2 20 - 29 mmol/L 23  23  23    Calcium 8.7 - 10.2 mg/dL 9.5  9.6  9.4   Total Protein 6.0 - 8.5 g/dL 7.9  8.2  7.9   Total Bilirubin 0.0 - 1.2 mg/dL 0.7  0.3  0.5   Alkaline Phos 44 - 121 IU/L 87  79  62   AST 0 - 40 IU/L 25  18  19    ALT 0 - 32 IU/L 24  12  17     Lipid Panel     Component Value Date/Time   CHOL 195 07/29/2021 1106   TRIG 71 07/29/2021 1106   HDL 60 07/29/2021 1106   CHOLHDL 3.3 07/29/2021 1106   LDLCALC 122 (H) 07/29/2021 1106    CBC    Component Value Date/Time   WBC 7.6 07/29/2021 1106   RBC 5.42 (H) 07/29/2021 1106   HGB 12.0 07/29/2021 1106   HCT 38.2 07/29/2021 1106   PLT 349 07/29/2021 1106   MCV 71 (L) 07/29/2021 1106   MCH 22.1 (L) 07/29/2021 1106   MCHC 31.4 (L) 07/29/2021 1106   RDW 12.9 07/29/2021 1106   LYMPHSABS 2.4 02/19/2018 1548   EOSABS 1.5 (H) 02/19/2018 1548   BASOSABS 0.1 02/19/2018 1548    ASSESSMENT AND PLAN: 1. Annual physical exam (Primary) Encouraged her to continue healthy eating habits and to move as much as she can. Recommend vitamin D  supplement given that she is postmenopausal - CBC - Comprehensive metabolic panel with GFR - Vitamin D , Cholecalciferol, 10 MCG (400 UNIT) CAPS; Take 10  mcg by mouth daily.  Dispense: 100 capsule; Refill: 1  2. Acute pain of left shoulder Of questionable etiology.  Good range of motion on exam.  Patient reports similar episode a few years ago that responded well to Naprosyn .  We will give a trial of Naprosyn  at this time.  Follow-up if no improvement - naproxen  (NAPROSYN ) 500 MG tablet; Take 1 tablet (500 mg total) by mouth 2 (two) times daily as needed.  Dispense: 40 tablet; Refill: 1  3. Bilateral carpal tunnel syndrome Discussed diagnosis with patient and management.  Will start with conservative therapy first with cock up wrist splints.  If no improvement or any worsening, she should return so that we can refer for EMG - For home use only DME Other see comment  4. Prediabetes Will screen for diabetes today  5. Hyperlipidemia, unspecified hyperlipidemia type Plan to recheck lipid profile today.  Encouraged her to continue plant-based diet. - Comprehensive metabolic panel with GFR - Lipid panel  6. Encounter for screening mammogram for malignant neoplasm of breast - MM 3D SCREENING MAMMOGRAM BILATERAL BREAST; Future  7. Screening for colon cancer Discussed colon cancer screening methods.  She prefers colonoscopy. - Ambulatory referral to Gastroenterology  8. Need for shingles vaccine - Varicella-zoster vaccine IM  9. HIV screening declined  10. Need for hepatitis C screening test - Hepatitis C Antibody  11. Routine eye exam - Ambulatory referral to Optometry    Patient was given the opportunity to ask questions.  Patient verbalized understanding of the plan and was able to repeat key elements of the plan.  This documentation was completed using Paediatric nurse.  Any transcriptional errors are unintentional.  Orders Placed This Encounter  Procedures   For home use only DME Other see comment   MM 3D SCREENING MAMMOGRAM BILATERAL BREAST   Varicella-zoster vaccine IM   Hepatitis C Antibody   CBC    Comprehensive metabolic panel with GFR   Lipid panel   Ambulatory referral to Gastroenterology   Ambulatory referral to Optometry     Requested Prescriptions   Signed Prescriptions Disp Refills   naproxen  (NAPROSYN ) 500 MG tablet 40 tablet 1    Sig: Take 1 tablet (500 mg total) by mouth 2 (two) times daily as needed.   Vitamin D , Cholecalciferol, 10 MCG (400 UNIT) CAPS 100 capsule 1    Sig: Take 10 mcg by mouth daily.    Return in about 1 year (around 11/08/2024) for Give nurse only visit in 4 months for 2nd shingles vaccine.  Concetta Dee, MD, FACP

## 2023-11-10 ENCOUNTER — Ambulatory Visit: Payer: Self-pay | Admitting: Internal Medicine

## 2023-11-10 DIAGNOSIS — R718 Other abnormality of red blood cells: Secondary | ICD-10-CM

## 2023-11-10 LAB — COMPREHENSIVE METABOLIC PANEL WITH GFR
ALT: 16 IU/L (ref 0–32)
AST: 16 IU/L (ref 0–40)
Albumin: 4.7 g/dL (ref 3.8–4.9)
Alkaline Phosphatase: 98 IU/L (ref 44–121)
BUN/Creatinine Ratio: 16 (ref 9–23)
BUN: 9 mg/dL (ref 6–24)
Bilirubin Total: 0.6 mg/dL (ref 0.0–1.2)
CO2: 21 mmol/L (ref 20–29)
Calcium: 9.6 mg/dL (ref 8.7–10.2)
Chloride: 104 mmol/L (ref 96–106)
Creatinine, Ser: 0.58 mg/dL (ref 0.57–1.00)
Globulin, Total: 3.2 g/dL (ref 1.5–4.5)
Glucose: 98 mg/dL (ref 70–99)
Potassium: 4.3 mmol/L (ref 3.5–5.2)
Sodium: 140 mmol/L (ref 134–144)
Total Protein: 7.9 g/dL (ref 6.0–8.5)
eGFR: 107 mL/min/{1.73_m2} (ref >59)

## 2023-11-10 LAB — CBC
Hematocrit: 41.3 % (ref 34.0–46.6)
Hemoglobin: 12.2 g/dL (ref 11.1–15.9)
MCH: 22.1 pg — ABNORMAL LOW (ref 26.6–33.0)
MCHC: 29.5 g/dL — ABNORMAL LOW (ref 31.5–35.7)
MCV: 75 fL — ABNORMAL LOW (ref 79–97)
Platelets: 342 10*3/uL (ref 150–450)
RBC: 5.51 x10E6/uL — ABNORMAL HIGH (ref 3.77–5.28)
RDW: 13.6 % (ref 11.7–15.4)
WBC: 7.2 10*3/uL (ref 3.4–10.8)

## 2023-11-10 LAB — LIPID PANEL
Chol/HDL Ratio: 2.9 ratio (ref 0.0–4.4)
Cholesterol, Total: 174 mg/dL (ref 100–199)
HDL: 61 mg/dL (ref >39)
LDL Chol Calc (NIH): 99 mg/dL (ref 0–99)
Triglycerides: 72 mg/dL (ref 0–149)
VLDL Cholesterol Cal: 14 mg/dL (ref 5–40)

## 2023-11-10 LAB — HEPATITIS C ANTIBODY: Hep C Virus Ab: NONREACTIVE

## 2023-11-10 NOTE — Progress Notes (Signed)
 Screen for hepatitis C is negative. Kidney and liver function test normal. Cholesterol levels are normal. Blood sugar level is normal. Blood cell counts are okay but there are findings suggestive of underlying iron deficiency or some other inherited blood disorder.  Please return to the lab at her earliest convenience to have additional blood test done.  Please give lab appointment.

## 2023-12-10 DIAGNOSIS — Z419 Encounter for procedure for purposes other than remedying health state, unspecified: Secondary | ICD-10-CM | POA: Diagnosis not present

## 2023-12-21 ENCOUNTER — Ambulatory Visit: Attending: Internal Medicine

## 2023-12-21 DIAGNOSIS — R718 Other abnormality of red blood cells: Secondary | ICD-10-CM

## 2023-12-23 LAB — HGB FRACTIONATION BY HPLC
Hgb A: 97.4 % (ref 96.4–98.8)
Hgb C: 0 %
Hgb E: 0 %
Hgb F: 0 % (ref 0.0–2.0)
Hgb S: 0 %
Hgb Variant: 0.9 % — ABNORMAL HIGH

## 2023-12-23 LAB — IRON,TIBC AND FERRITIN PANEL
Ferritin: 357 ng/mL — ABNORMAL HIGH (ref 15–150)
Iron Saturation: 42 % (ref 15–55)
Iron: 132 ug/dL (ref 27–159)
Total Iron Binding Capacity: 315 ug/dL (ref 250–450)
UIBC: 183 ug/dL (ref 131–425)

## 2023-12-23 LAB — HGB FRACTIONATION CASCADE: Hgb A2: 1.7 % — ABNORMAL LOW (ref 1.8–3.2)

## 2023-12-30 ENCOUNTER — Ambulatory Visit: Payer: Self-pay | Admitting: Internal Medicine

## 2023-12-30 DIAGNOSIS — D563 Thalassemia minor: Secondary | ICD-10-CM | POA: Insufficient documentation

## 2023-12-30 NOTE — Telephone Encounter (Signed)
 Phone call placed to patient's daughter, Armida, today to go over the results of recent lab test.  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  I had ordered the hemoglobin electrophoresis due to patient having low normal hemoglobin with microcytosis.  Blood cell counts have been stable over the past several years so I recommend that we just observe for now.  If she ever develops any hemolysis, we will need to refer to hematology. Patient also inquired about patient's other blood test including hepatitis C screening.  I went over those results with her.  She inquired about the referral for colonoscopy stating that they had not received a call.  I do see where [facility] tried to call them.  I gave her the phone number to call them back to schedule the colonoscopy.  She thanked me for the call.  All questions were answered.

## 2023-12-31 ENCOUNTER — Encounter: Payer: Self-pay | Admitting: Internal Medicine

## 2024-01-09 DIAGNOSIS — Z419 Encounter for procedure for purposes other than remedying health state, unspecified: Secondary | ICD-10-CM | POA: Diagnosis not present

## 2024-01-26 ENCOUNTER — Ambulatory Visit (AMBULATORY_SURGERY_CENTER): Admitting: *Deleted

## 2024-01-26 VITALS — Ht 61.0 in | Wt 126.0 lb

## 2024-01-26 DIAGNOSIS — Z1211 Encounter for screening for malignant neoplasm of colon: Secondary | ICD-10-CM

## 2024-01-26 MED ORDER — PEG 3350-KCL-NA BICARB-NACL 420 G PO SOLR: 4000.0000 mL | Freq: Once | ORAL | 0 refills | Status: AC

## 2024-01-26 NOTE — Progress Notes (Signed)
 Pt's name and DOB verified at the beginning of the pre-visit wit 2 identifiers  Permission given to speak with  Pt denies any difficulty with ambulating,sitting, laying down or rolling side to side  Pt has no issues moving head neck or swallowing  No egg or soy allergy known to patient   No issues known to pt with past sedation with any surgeries or procedures  Patient denies ever being intubated  No FH of Malignant Hyperthermia  Pt is not on home 02   Pt is not on blood thinners   Pt has frequent issues with constipation RN instructed pt to use Miralax per bottles instructions a week before prep days. Pt states they will  Pt is not on dialysis  Pt denise any abnormal heart rhythms   Pt denies any upcoming cardiac testing  Patient's chart reviewed by Norleen Schillings CNRA prior to pre-visit and patient appropriate for the [facility].  Pre-visit completed and red dot placed by patient's name on their procedure day (on provider's schedule).    Visit in person with interpretor   Pt states weight is 126 lb   IInstructions reviewed. Pt given  both [facility] main # and MD on call # prior to instructions.  Pt states understanding of instructions. Instructed pt to review instructions again prior to procedure and call main # given if has questions.. Pt states they will.   Instructed pt on where to find instructions on My Chart.

## 2024-02-09 DIAGNOSIS — Z419 Encounter for procedure for purposes other than remedying health state, unspecified: Secondary | ICD-10-CM | POA: Diagnosis not present

## 2024-02-23 ENCOUNTER — Encounter: Admitting: Internal Medicine

## 2024-03-11 DIAGNOSIS — Z419 Encounter for procedure for purposes other than remedying health state, unspecified: Secondary | ICD-10-CM | POA: Diagnosis not present

## 2024-03-14 ENCOUNTER — Ambulatory Visit

## 2024-04-04 ENCOUNTER — Ambulatory Visit: Attending: Internal Medicine

## 2024-04-04 DIAGNOSIS — Z23 Encounter for immunization: Secondary | ICD-10-CM

## 2024-04-04 NOTE — Progress Notes (Signed)
Shingrix vaccine administered per protocols.  Information sheet given. Patient denies and pain or discomfort at injection site. Tolerated injection well no reaction.

## 2024-04-10 DIAGNOSIS — Z419 Encounter for procedure for purposes other than remedying health state, unspecified: Secondary | ICD-10-CM | POA: Diagnosis not present

## 2024-11-08 ENCOUNTER — Encounter: Admitting: Internal Medicine
